# Patient Record
Sex: Male | Born: 1990 | Race: Black or African American | Hispanic: No | Marital: Married | Smoking: Current some day smoker
Health system: Southern US, Community
[De-identification: ages and names within clinical notes are randomized; demographics above are authoritative.]

---

## 2019-03-04 ENCOUNTER — Emergency Department (HOSPITAL_COMMUNITY)
Admission: EM | Admit: 2019-03-04 | Discharge: 2019-03-04 | Disposition: A | Discharge status: Home / Self Care | Payer: Self-pay | Attending: Emergency Medicine | Admitting: Emergency Medicine

## 2019-03-04 ENCOUNTER — Encounter (HOSPITAL_COMMUNITY): Payer: Self-pay

## 2019-03-04 ENCOUNTER — Other Ambulatory Visit: Payer: Self-pay

## 2019-03-04 DIAGNOSIS — J069 Acute upper respiratory infection, unspecified: Secondary | ICD-10-CM | POA: Insufficient documentation

## 2019-03-04 DIAGNOSIS — F17210 Nicotine dependence, cigarettes, uncomplicated: Secondary | ICD-10-CM | POA: Insufficient documentation

## 2019-03-04 LAB — GROUP A STREP BY PCR: Group A Strep by PCR: NOT DETECTED

## 2019-03-04 NOTE — ED Provider Notes (Signed)
[facility] EMERGENCY DEPARTMENT Provider Note   CSN: [id number] Arrival date & time: 03/04/19  0735    History   Chief Complaint Chief Complaint  Patient presents with  . Sore Throat  . Nasal Congestion    HPI Nicholas House. is a 28 y.o. male.     HPI   28 year old male with no significant past medical history presents today with complaints of sore throat and upper respiratory infection.  Patient had symptoms started approximately 1 week ago with rhinorrhea nasal congestion and sore throat.  He notes painful swallowing no difficulty swallowing no pooling of secretions.  He notes rhinorrhea and nasal congestion.  He notes taking NyQuil at home without symptomatic improvement.  He denies any fever, denies any cough or shortness of breath.  He denies any close sick contacts, denies any recent travel.  He works at a call center.     History reviewed. No pertinent past medical history.  There are no active problems to display for this patient.   History reviewed. No pertinent surgical history.      Home Medications    Prior to Admission medications   Not on File    Family History No family history on file.  Social History Social History   Tobacco Use  . Smoking status: Current Some Day Smoker    Types: Cigarettes  Substance Use Topics  . Alcohol use: Yes  . Drug use: Yes    Types: Marijuana     Allergies   Patient has no allergy information on record.   Review of Systems Review of Systems  All other systems reviewed and are negative.    Physical Exam Updated Vital Signs BP (!) 135/95 (BP Location: Right Arm)   Pulse 88   Temp 98.1 F (36.7 C)   Resp 12   SpO2 100%   Physical Exam Vitals signs and nursing note reviewed.  Constitutional:      Appearance: He is well-developed.  HENT:     Head: Normocephalic and atraumatic.     Comments: Oropharynx with minor erythema, minimal bilateral tonsillar swelling, no  exudate noted, uvula midline rises with phonation, no pooling of secretions, voice normal neck supple full active range of motion-rhinorrhea noted Eyes:     General: No scleral icterus.       Right eye: No discharge.        Left eye: No discharge.     Conjunctiva/sclera: Conjunctivae normal.     Pupils: Pupils are equal, round, and reactive to light.  Neck:     Musculoskeletal: Normal range of motion.     Vascular: No JVD.     Trachea: No tracheal deviation.  Pulmonary:     Effort: Pulmonary effort is normal. No respiratory distress.     Breath sounds: Normal breath sounds. No stridor. No wheezing, rhonchi or rales.  Neurological:     Mental Status: He is alert and oriented to person, place, and time.     Coordination: Coordination normal.  Psychiatric:        Behavior: Behavior normal.        Thought Content: Thought content normal.        Judgment: Judgment normal.      ED Treatments / Results  Labs (all labs ordered are listed, but only abnormal results are displayed) Labs Reviewed  GROUP A STREP BY PCR    EKG None  Radiology No results found.  Procedures Procedures (including critical care time)  Medications  Ordered in ED Medications - No data to display   Initial Impression / Assessment and Plan / ED Course  I have reviewed the triage vital signs and the nursing notes.  Pertinent labs & imaging results that were available during my care of the patient were reviewed by me and considered in my medical decision making (see chart for details).        Labs: Group A strep by PCR  Imaging:  Consults:  Therapeutics:  Discharge Meds:   Assessment/Plan: 28 year old male presents today with likely viral URI.  Patient is very well-appearing in no acute distress.  He has negative group A strep by PCR here.  He has no signs of deep space infection.  He is afebrile I have lower suspicion for influenza or coronavirus at this time although they remain in the  differential.  Patient is stable for outpatient management, return precautions given.  Verbalized understanding and agreement to today's plan.  Taris Villalva. was evaluated in Emergency Department on 03/04/2019 for the symptoms described in the history of present illness. He was evaluated in the context of the global COVID-19 pandemic, which necessitated consideration that the patient might be at risk for infection with the SARS-CoV-2 virus that causes COVID-19. Institutional protocols and algorithms that pertain to the evaluation of patients at risk for COVID-19 are in a state of rapid change based on information released by regulatory bodies including the CDC and federal and state organizations. These policies and algorithms were followed during the patient's care in the ED.    Final Clinical Impressions(s) / ED Diagnoses   Final diagnoses:  Viral upper respiratory tract infection    ED Discharge Orders    None       Eyvonne Mechanic, PA-C 03/04/19 0845    Gwyneth Sprout, MD 03/04/19 1505

## 2019-03-04 NOTE — Discharge Instructions (Addendum)
Please read attached information. If you experience any new or worsening signs or symptoms please return to the emergency room for evaluation. Please follow-up with your primary care provider or specialist as discussed. Please use medication prescribed only as directed and discontinue taking if you have any concerning signs or symptoms.   °

## 2019-03-04 NOTE — ED Notes (Signed)
Jeff PA at bedside.  

## 2019-03-04 NOTE — ED Triage Notes (Signed)
Per pt: He states that his throat has been hurting for the last few days. Pt states that pain is constant. Denies contact with known corona virus. Pt states that he took nyquil for pain last night and that it did not help. No meds PTA. Pt also has a runny nose with thin clear discharge.

## 2019-04-21 ENCOUNTER — Other Ambulatory Visit: Payer: Self-pay

## 2019-04-21 ENCOUNTER — Encounter (HOSPITAL_COMMUNITY): Payer: Self-pay

## 2019-04-21 ENCOUNTER — Inpatient Hospital Stay (HOSPITAL_COMMUNITY)
Admission: EM | Admit: 2019-04-21 | Discharge: 2019-04-21 | DRG: 711 | Disposition: A | Discharge status: Home / Self Care | Payer: Self-pay | Attending: Urology | Admitting: Urology

## 2019-04-21 ENCOUNTER — Emergency Department (HOSPITAL_COMMUNITY): Payer: Self-pay | Admitting: Certified Registered"

## 2019-04-21 ENCOUNTER — Ambulatory Visit (HOSPITAL_COMMUNITY): Payer: Self-pay

## 2019-04-21 ENCOUNTER — Encounter (HOSPITAL_COMMUNITY)
Admission: EM | Disposition: A | Discharge status: Home / Self Care | Payer: Self-pay | Source: Home / Self Care | Attending: Urology

## 2019-04-21 DIAGNOSIS — Z9889 Other specified postprocedural states: Secondary | ICD-10-CM

## 2019-04-21 DIAGNOSIS — F17210 Nicotine dependence, cigarettes, uncomplicated: Secondary | ICD-10-CM | POA: Diagnosis present

## 2019-04-21 DIAGNOSIS — U071 COVID-19: Secondary | ICD-10-CM | POA: Diagnosis present

## 2019-04-21 DIAGNOSIS — N4400 Torsion of testis, unspecified: Principal | ICD-10-CM | POA: Diagnosis present

## 2019-04-21 DIAGNOSIS — Z791 Long term (current) use of non-steroidal anti-inflammatories (NSAID): Secondary | ICD-10-CM

## 2019-04-21 DIAGNOSIS — Z91010 Allergy to peanuts: Secondary | ICD-10-CM

## 2019-04-21 DIAGNOSIS — N433 Hydrocele, unspecified: Secondary | ICD-10-CM | POA: Diagnosis present

## 2019-04-21 HISTORY — PX: ORCHIOPEXY: SHX479

## 2019-04-21 LAB — COMPREHENSIVE METABOLIC PANEL
ALT: 23 U/L (ref 0–44)
AST: 26 U/L (ref 15–41)
Albumin: 4.5 g/dL (ref 3.5–5.0)
Alkaline Phosphatase: 53 U/L (ref 38–126)
Anion gap: 14 (ref 5–15)
BUN: 12 mg/dL (ref 6–20)
CO2: 22 mmol/L (ref 22–32)
Calcium: 9.8 mg/dL (ref 8.9–10.3)
Chloride: 103 mmol/L (ref 98–111)
Creatinine, Ser: 1.13 mg/dL (ref 0.61–1.24)
GFR calc Af Amer: >60 mL/min (ref >60)
GFR calc non Af Amer: >60 mL/min (ref >60)
Glucose, Bld: 141 mg/dL — ABNORMAL HIGH (ref 70–99)
Potassium: 3.1 mmol/L — ABNORMAL LOW (ref 3.5–5.1)
Sodium: 139 mmol/L (ref 135–145)
Total Bilirubin: 1.3 mg/dL — ABNORMAL HIGH (ref 0.3–1.2)
Total Protein: 7.7 g/dL (ref 6.5–8.1)

## 2019-04-21 LAB — CBC WITH DIFFERENTIAL/PLATELET
Abs Immature Granulocytes: 0.06 10*3/uL (ref 0.00–0.07)
Basophils Absolute: 0 10*3/uL (ref 0.0–0.1)
Basophils Relative: 0 %
Eosinophils Absolute: 0 10*3/uL (ref 0.0–0.5)
Eosinophils Relative: 0 %
HCT: 44 % (ref 39.0–52.0)
Hemoglobin: 14.7 g/dL (ref 13.0–17.0)
Immature Granulocytes: 1 %
Lymphocytes Relative: 8 %
Lymphs Abs: 1 10*3/uL (ref 0.7–4.0)
MCH: 28.1 pg (ref 26.0–34.0)
MCHC: 33.4 g/dL (ref 30.0–36.0)
MCV: 84 fL (ref 80.0–100.0)
Monocytes Absolute: 0.5 10*3/uL (ref 0.1–1.0)
Monocytes Relative: 4 %
Neutro Abs: 11.4 10*3/uL — ABNORMAL HIGH (ref 1.7–7.7)
Neutrophils Relative %: 87 %
Platelets: 265 10*3/uL (ref 150–400)
RBC: 5.24 MIL/uL (ref 4.22–5.81)
RDW: 12.6 % (ref 11.5–15.5)
WBC: 13 10*3/uL — ABNORMAL HIGH (ref 4.0–10.5)
nRBC: 0 % (ref 0.0–0.2)

## 2019-04-21 LAB — SARS CORONAVIRUS 2 BY RT PCR (HOSPITAL ORDER, PERFORMED IN ~~LOC~~ HOSPITAL LAB): SARS Coronavirus 2: POSITIVE — AB

## 2019-04-21 LAB — LIPASE, BLOOD: Lipase: 24 U/L (ref 11–51)

## 2019-04-21 SURGERY — ORCHIOPEXY ADULT
Anesthesia: General | Laterality: Bilateral

## 2019-04-21 MED ORDER — SODIUM CHLORIDE 0.9 % IV SOLN
INTRAVENOUS | Status: AC
  Filled 2019-04-21: qty 500000

## 2019-04-21 MED ORDER — SUCCINYLCHOLINE CHLORIDE 20 MG/ML IJ SOLN
INTRAMUSCULAR | Status: DC | PRN
  Administered 2019-04-21: 120 mg via INTRAVENOUS

## 2019-04-21 MED ORDER — PROPOFOL 10 MG/ML IV BOLUS
INTRAVENOUS | Status: AC
  Filled 2019-04-21: qty 20

## 2019-04-21 MED ORDER — PROPOFOL 10 MG/ML IV BOLUS
INTRAVENOUS | Status: DC | PRN
  Administered 2019-04-21: 180 mg via INTRAVENOUS

## 2019-04-21 MED ORDER — MORPHINE SULFATE (PF) 4 MG/ML IV SOLN
4.0000 mg | Freq: Once | INTRAVENOUS | Status: AC
  Administered 2019-04-21: 4 mg via INTRAVENOUS
  Filled 2019-04-21: qty 1

## 2019-04-21 MED ORDER — FENTANYL CITRATE (PF) 100 MCG/2ML IJ SOLN
INTRAMUSCULAR | Status: DC | PRN
  Administered 2019-04-21: 50 ug via INTRAVENOUS
  Administered 2019-04-21 (×2): 100 ug via INTRAVENOUS

## 2019-04-21 MED ORDER — CEFAZOLIN SODIUM-DEXTROSE 2-4 GM/100ML-% IV SOLN
2.0000 g | Freq: Once | INTRAVENOUS | Status: AC
  Administered 2019-04-21: 2 g via INTRAVENOUS

## 2019-04-21 MED ORDER — FENTANYL CITRATE (PF) 250 MCG/5ML IJ SOLN
INTRAMUSCULAR | Status: AC
  Filled 2019-04-21: qty 5

## 2019-04-21 MED ORDER — SUCCINYLCHOLINE CHLORIDE 200 MG/10ML IV SOSY
PREFILLED_SYRINGE | INTRAVENOUS | Status: AC
  Filled 2019-04-21: qty 10

## 2019-04-21 MED ORDER — PHENYLEPHRINE 40 MCG/ML (10ML) SYRINGE FOR IV PUSH (FOR BLOOD PRESSURE SUPPORT)
PREFILLED_SYRINGE | INTRAVENOUS | Status: AC
  Filled 2019-04-21: qty 10

## 2019-04-21 MED ORDER — ONDANSETRON HCL 4 MG/2ML IJ SOLN
4.0000 mg | Freq: Once | INTRAMUSCULAR | Status: AC
  Administered 2019-04-21: 13:00:00 4 mg via INTRAVENOUS
  Filled 2019-04-21: qty 2

## 2019-04-21 MED ORDER — ONDANSETRON HCL 4 MG/2ML IJ SOLN
INTRAMUSCULAR | Status: DC | PRN
  Administered 2019-04-21: 4 mg via INTRAVENOUS

## 2019-04-21 MED ORDER — SODIUM CHLORIDE 0.9 % IV BOLUS
1000.0000 mL | Freq: Once | INTRAVENOUS | Status: AC
  Administered 2019-04-21: 1000 mL via INTRAVENOUS

## 2019-04-21 MED ORDER — BUPIVACAINE HCL (PF) 0.25 % IJ SOLN
INTRAMUSCULAR | Status: AC
  Filled 2019-04-21: qty 30

## 2019-04-21 MED ORDER — CEFAZOLIN (ANCEF) 1 G IV SOLR: 2.0000 g | INTRAVENOUS | Status: DC

## 2019-04-21 MED ORDER — SENNOSIDES-DOCUSATE SODIUM 8.6-50 MG PO TABS: 1.0000 | ORAL_TABLET | Freq: Two times a day (BID) | ORAL | 0 refills | Status: DC

## 2019-04-21 MED ORDER — LACTATED RINGERS IV SOLN
INTRAVENOUS | Status: DC
  Administered 2019-04-21 (×3): via INTRAVENOUS

## 2019-04-21 MED ORDER — MIDAZOLAM HCL 2 MG/2ML IJ SOLN
INTRAMUSCULAR | Status: AC
  Filled 2019-04-21: qty 2

## 2019-04-21 MED ORDER — OXYCODONE-ACETAMINOPHEN 5-325 MG PO TABS: 1.0000 | ORAL_TABLET | Freq: Four times a day (QID) | ORAL | 0 refills | Status: AC | PRN

## 2019-04-21 MED ORDER — 0.9 % SODIUM CHLORIDE (POUR BTL) OPTIME
TOPICAL | Status: DC | PRN
  Administered 2019-04-21: 1000 mL

## 2019-04-21 MED ORDER — MIDAZOLAM HCL 2 MG/2ML IJ SOLN
INTRAMUSCULAR | Status: DC | PRN
  Administered 2019-04-21: 2 mg via INTRAVENOUS

## 2019-04-21 MED ORDER — BUPIVACAINE HCL (PF) 0.25 % IJ SOLN
INTRAMUSCULAR | Status: DC | PRN
  Administered 2019-04-21: 10 mL

## 2019-04-21 MED ORDER — ROCURONIUM 10MG/ML (10ML) SYRINGE FOR MEDFUSION PUMP - OPTIME
INTRAVENOUS | Status: DC | PRN
  Administered 2019-04-21: 40 mg via INTRAVENOUS

## 2019-04-21 MED ORDER — LIDOCAINE HCL 1 % IJ SOLN
INTRAMUSCULAR | Status: DC | PRN
  Administered 2019-04-21: 60 mg via INTRADERMAL

## 2019-04-21 MED ORDER — LIDOCAINE 2% (20 MG/ML) 5 ML SYRINGE
INTRAMUSCULAR | Status: AC
  Filled 2019-04-21: qty 5

## 2019-04-21 MED ORDER — SUGAMMADEX SODIUM 500 MG/5ML IV SOLN
INTRAVENOUS | Status: AC
  Filled 2019-04-21: qty 5

## 2019-04-21 MED ORDER — EPHEDRINE 5 MG/ML INJ
INTRAVENOUS | Status: AC
  Filled 2019-04-21: qty 10

## 2019-04-21 SURGICAL SUPPLY — 48 items
BAG DECANTER FOR FLEXI CONT (MISCELLANEOUS) ×1 IMPLANT
BAG URO CATCHER STRL LF (MISCELLANEOUS) ×1 IMPLANT
BLADE 10 SAFETY STRL DISP (BLADE) ×1 IMPLANT
BLADE SURG 15 STRL LF DISP TIS (BLADE) ×1 IMPLANT
BLADE SURG 15 STRL SS (BLADE) ×1
BNDG GAUZE ELAST 4 BULKY (GAUZE/BANDAGES/DRESSINGS) IMPLANT
BRIEF STRETCH FOR OB PAD LRG (UNDERPADS AND DIAPERS) ×2 IMPLANT
COVER SURGICAL LIGHT HANDLE (MISCELLANEOUS) ×2 IMPLANT
COVER WAND RF STERILE (DRAPES) ×2 IMPLANT
DRAIN PENROSE 1/4X12 LTX (DRAIN) ×1 IMPLANT
DRAPE HALF SHEET 40X57 (DRAPES) IMPLANT
DRAPE LAPAROTOMY T 102X78X121 (DRAPES) ×1 IMPLANT
ELECT REM PT RETURN 9FT ADLT (ELECTROSURGICAL) ×2
ELECTRODE REM PT RTRN 9FT ADLT (ELECTROSURGICAL) ×1 IMPLANT
GAUZE 4X4 16PLY RFD (DISPOSABLE) ×2 IMPLANT
GAUZE SPONGE 4X4 12PLY STRL (GAUZE/BANDAGES/DRESSINGS) ×1 IMPLANT
GLOVE BIOGEL M STRL SZ7.5 (GLOVE) ×2 IMPLANT
GOWN STRL REUS W/ TWL LRG LVL3 (GOWN DISPOSABLE) ×2 IMPLANT
GOWN STRL REUS W/ TWL XL LVL3 (GOWN DISPOSABLE) IMPLANT
GOWN STRL REUS W/TWL LRG LVL3 (GOWN DISPOSABLE) ×2
GOWN STRL REUS W/TWL XL LVL3 (GOWN DISPOSABLE) ×2
HANDPIECE INTERPULSE COAX TIP (DISPOSABLE)
KIT BASIN OR (CUSTOM PROCEDURE TRAY) ×2 IMPLANT
KIT TURNOVER KIT B (KITS) ×2 IMPLANT
LEGGING LITHOTOMY PAIR STRL (DRAPES) IMPLANT
NDL HYPO 25X1 1.5 SAFETY (NEEDLE) IMPLANT
NEEDLE HYPO 25X1 1.5 SAFETY (NEEDLE) ×2 IMPLANT
NS IRRIG 1000ML POUR BTL (IV SOLUTION) ×2 IMPLANT
PACK SURGICAL SETUP 50X90 (CUSTOM PROCEDURE TRAY) ×2 IMPLANT
PAD ARMBOARD 7.5X6 YLW CONV (MISCELLANEOUS) ×4 IMPLANT
PENCIL BUTTON HOLSTER BLD 10FT (ELECTRODE) ×2 IMPLANT
PENCIL SMOKE EVACUATOR (MISCELLANEOUS) ×1 IMPLANT
SET HNDPC FAN SPRY TIP SCT (DISPOSABLE) IMPLANT
SPONGE LAP 4X18 RFD (DISPOSABLE) ×1 IMPLANT
SUT CHROMIC 3 0 PS 2 (SUTURE) IMPLANT
SUT CHROMIC 3 0 SH 27 (SUTURE) IMPLANT
SUT CHROMIC 4 0 RB 1X27 (SUTURE) IMPLANT
SUT ETHILON 3 0 PS 1 (SUTURE) ×1 IMPLANT
SUT MNCRL AB 4-0 PS2 18 (SUTURE) ×1 IMPLANT
SUT PROLENE 4 0 RB 1 (SUTURE)
SUT PROLENE 4 0 SH DA (SUTURE) ×1 IMPLANT
SUT PROLENE 4-0 RB1 .5 CRCL 36 (SUTURE) IMPLANT
SUT VIC AB 3-0 SH 27 (SUTURE) ×1
SUT VIC AB 3-0 SH 27XBRD (SUTURE) IMPLANT
SYR CONTROL 10ML LL (SYRINGE) ×2 IMPLANT
TUBE CONNECTING 12X1/4 (SUCTIONS) ×2 IMPLANT
WATER STERILE IRR 1000ML POUR (IV SOLUTION) ×1 IMPLANT
YANKAUER SUCT BULB TIP NO VENT (SUCTIONS) ×2 IMPLANT

## 2019-04-21 NOTE — Anesthesia Procedure Notes (Signed)
Performed by: Lisel Siegrist A, CRNA       

## 2019-04-21 NOTE — Anesthesia Preprocedure Evaluation (Addendum)
Anesthesia Evaluation  Patient identified by MRN, date of birth, ID band Patient awake    Reviewed: Allergy & Precautions, NPO status , Patient's Chart, lab work & pertinent test results  Airway Mallampati: I  TM Distance: >3 FB Neck ROM: Full    Dental  (+) Teeth Intact, Dental Advisory Given   Pulmonary Current Smoker,    breath sounds clear to auscultation       Cardiovascular negative cardio ROS   Rhythm:Regular Rate:Normal     Neuro/Psych    GI/Hepatic negative GI ROS, Neg liver ROS,   Endo/Other  negative endocrine ROS  Renal/GU negative Renal ROS     Musculoskeletal negative musculoskeletal ROS (+)   Abdominal Normal abdominal exam  (+)   Peds  Hematology negative hematology ROS (+)   Anesthesia Other Findings   Reproductive/Obstetrics                            Lab Results  Component Value Date   WBC 13.0 (H) 04/21/2019   HGB 14.7 04/21/2019   HCT 44.0 04/21/2019   MCV 84.0 04/21/2019   PLT 265 04/21/2019     Anesthesia Physical Anesthesia Plan  ASA: I  Anesthesia Plan: General   Post-op Pain Management:    Induction: Intravenous, Rapid sequence and Cricoid pressure planned  PONV Risk Score and Plan: 2 and Ondansetron, Dexamethasone and Midazolam  Airway Management Planned: Oral ETT  Additional Equipment: None  Intra-op Plan:   Post-operative Plan: Extubation in OR  Informed Consent: I have reviewed the patients History and Physical, chart, labs and discussed the procedure including the risks, benefits and alternatives for the proposed anesthesia with the patient or authorized representative who has indicated his/her understanding and acceptance.     Dental advisory given  Plan Discussed with: CRNA  Anesthesia Plan Comments: ( )       Anesthesia Quick Evaluation

## 2019-04-21 NOTE — ED Triage Notes (Signed)
Pt reports severe right testicle pain radiating to back and down right leg and N/V starting suddenly this am at 0700.Took ibuprofen at home without relief.

## 2019-04-21 NOTE — ED Notes (Signed)
Nurse navigator notified patient primary nurse giving report and about to go to surgery. Patient currently on phone and will let family now himself.

## 2019-04-21 NOTE — Anesthesia Postprocedure Evaluation (Signed)
Anesthesia Post Note  Patient: Nicholas House.  Procedure(s) Performed: Open right detorsion and bilateral orchiopexy (Bilateral )     Patient location during evaluation: Other Anesthesia Type: General Level of consciousness: awake and alert, patient cooperative and oriented Pain management: pain level controlled Vital Signs Assessment: post-procedure vital signs reviewed and stable Respiratory status: spontaneous breathing, nonlabored ventilation and respiratory function stable Cardiovascular status: blood pressure returned to baseline and stable Postop Assessment: no apparent nausea or vomiting and able to ambulate Anesthetic complications: no                   [name],E. Nusayba Cadenas

## 2019-04-21 NOTE — Progress Notes (Signed)
PT GAVE VERBAL CONSENT AT 1811 TO GIVE MEDICAL INFORMATION TO FATHER

## 2019-04-21 NOTE — OR Nursing (Signed)
DISCUSSED DISCHARGE AND COVID INSTRUCTIONS WITH PATIENT

## 2019-04-21 NOTE — Brief Op Note (Signed)
04/21/2019  5:46 PM  PATIENT:  Nicholas House.  28 y.o. male  PRE-OPERATIVE DIAGNOSIS:  Right testicular torsion  POST-OPERATIVE DIAGNOSIS:  * No post-op diagnosis entered *  PROCEDURE:  Procedure(s): Open right detorsion and bilateral orchiopexy (Bilateral)  SURGEON:  Surgeon(s) and Role:    Sebastian Ache, MD - Primary  PHYSICIAN ASSISTANT:   ASSISTANTS: none   ANESTHESIA:   local and general  EBL:  minimal   BLOOD ADMINISTERED:none  DRAINS: Penrose drain in the dependant scrotum   LOCAL MEDICATIONS USED:  MARCAINE     SPECIMEN:  No Specimen  DISPOSITION OF SPECIMEN:  N/A  COUNTS:  YES  TOURNIQUET:  * No tourniquets in log *  DICTATION: .Other Dictation: Dictation Number [id number]  PLAN OF CARE: Discharge to home after PACU  PATIENT DISPOSITION:  remain to recover in COVID OR   Delay start of Pharmacological VTE agent (>24hrs) due to surgical blood loss or risk of bleeding: yes

## 2019-04-21 NOTE — Transfer of Care (Signed)
Immediate Anesthesia Transfer of Care Note  Patient: Nicholas House.  Procedure(s) Performed: Open right detorsion and bilateral orchiopexy (Bilateral )  Patient Location: PACU and discharged home  Anesthesia Type:General  Level of Consciousness: awake, alert  and oriented  Airway & Oxygen Therapy: Patient Spontanous Breathing  Post-op Assessment: instructions to father  Post vital signs: Reviewed and stable  Last Vitals:  Vitals Value Taken Time  BP    Temp    Pulse    Resp    SpO2      Last Pain:  Vitals:   04/21/19 1253  TempSrc:   PainSc: 10-Worst pain ever      Patients Stated Pain Goal: 0 (04/21/19 1253)  Complications: No apparent anesthesia complications

## 2019-04-21 NOTE — ED Notes (Signed)
This RN acting as Statistician- pt able to call family. Has cell phone present.

## 2019-04-21 NOTE — Anesthesia Procedure Notes (Signed)
Procedure Name: Intubation Date/Time: 04/21/2019 5:04 PM Performed by: Adair Laundry, CRNA Pre-anesthesia Checklist: Patient identified Patient Re-evaluated:Patient Re-evaluated prior to induction Oxygen Delivery Method: Circle system utilized Preoxygenation: Pre-oxygenation with 100% oxygen Induction Type: IV induction Laryngoscope Size: Glidescope Grade View: Grade I Tube type: Oral Tube size: 7.5 mm Number of attempts: 1 Airway Equipment and Method: Stylet and Video-laryngoscopy Placement Confirmation: ETT inserted through vocal cords under direct vision and positive ETCO2 Secured at: 23 cm Tube secured with: Tape Dental Injury: Teeth and Oropharynx as per pre-operative assessment

## 2019-04-21 NOTE — H&P (Signed)
Nicholas ParkerJerry Michael Casselman Jr. is an 28 y.o. male.    Chief Complaint: RIGHT Testicular Torsion  HPI:   1 - RIGHT Testicular Torsion - acute onset right scrotal pain abtout 7 am and worsened throughout day. Dopplar US form ER with no right flow c/w acute torsion. Left side unremarkable. NO scrotal trauma. NO prior episodes. Last meal crackers at 7am.  PMH sig for knee surgery. NO CV disease / blood thinners. 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  Today "Dorene SorrowJerry" is seen for emergent evaluation of likely testicular torsion. No fevers, resp symptoms, known COVID contacts.   History reviewed. No pertinent past medical history.  History reviewed. No pertinent surgical history.  History reviewed. No pertinent family history. Social History:  reports that he has been smoking cigarettes. He has never used smokeless tobacco. He reports current alcohol use. He reports current drug use. Drug: Marijuana.  Allergies:  Allergies  Allergen Reactions  . Peanut-Containing Drug Products Other (See Comments)    Makes him sick    Medications Prior to Admission  Medication Sig Dispense Refill  . ibuprofen (ADVIL) 200 MG tablet Take 200 mg by mouth every 6 (six) hours as needed for moderate pain.      Results for orders placed or performed during the hospital encounter of 04/21/19 (from the past 48 hour(s))  Comprehensive metabolic panel     Status: Abnormal   Collection Time: 04/21/19 12:50 PM  Result Value Ref Range   Sodium 139 135 - 145 mmol/L   Potassium 3.1 (L) 3.5 - 5.1 mmol/L   Chloride 103 98 - 111 mmol/L   CO2 22 22 - 32 mmol/L   Glucose, Bld 141 (H) 70 - 99 mg/dL   BUN 12 6 - 20 mg/dL   Creatinine, Ser 4.091.13 0.61 - 1.24 mg/dL   Calcium 9.8 8.9 - 81.110.3 mg/dL   Total Protein 7.7 6.5 - 8.1 g/dL   Albumin 4.5 3.5 - 5.0 g/dL   AST 26 15 - 41 U/L   ALT 23 0 - 44 U/L   Alkaline Phosphatase 53 38 - 126 U/L   Total Bilirubin 1.3 (H) 0.3 - 1.2 mg/dL   GFR calc non Af Amer >60  >60 mL/min   GFR calc Af Amer >60 >60 mL/min   Anion gap 14 5 - 15    Comment: Performed at [facility], [address] [place], [place] [address]  CBC with Differential     Status: Abnormal   Collection Time: 04/21/19 12:50 PM  Result Value Ref Range   WBC 13.0 (H) 4.0 - 10.5 K/uL   RBC 5.24 4.22 - 5.81 MIL/uL   Hemoglobin 14.7 13.0 - 17.0 g/dL   HCT 29.544.0 62.139.0 - 30.852.0 %   MCV 84.0 80.0 - 100.0 fL   MCH 28.1 26.0 - 34.0 pg   MCHC 33.4 30.0 - 36.0 g/dL   RDW 65.712.6 84.611.5 - 96.215.5 %   Platelets 265 150 - 400 K/uL   nRBC 0.0 0.0 - 0.2 %   Neutrophils Relative % 87 %   Neutro Abs 11.4 (H) 1.7 - 7.7 K/uL   Lymphocytes Relative 8 %   Lymphs Abs 1.0 0.7 - 4.0 K/uL   Monocytes Relative 4 %   Monocytes Absolute 0.5 0.1 - 1.0 K/uL   Eosinophils Relative 0 %   Eosinophils Absolute 0.0 0.0 - 0.5 K/uL   Basophils Relative 0 %   Basophils Absolute 0.0 0.0 - 0.1  K/uL   Immature Granulocytes 1 %   Abs Immature Granulocytes 0.06 0.00 - 0.07 K/uL    Comment: Performed at [facility], [address], [place], [place] [address]  Lipase, blood     Status: None   Collection Time: 04/21/19 12:50 PM  Result Value Ref Range   Lipase 24 11 - 51 U/L    Comment: Performed at [facility], [address], [place], [place] [address]   US Scrotum W/doppler  Result Date: 04/21/2019 CLINICAL DATA:  Right testicular pain since this morning. EXAM: SCROTAL ULTRASOUND DOPPLER ULTRASOUND OF THE TESTICLES TECHNIQUE: Complete ultrasound examination of the testicles, epididymis, and other scrotal structures was performed. Color and spectral Doppler ultrasound were also utilized to evaluate blood flow to the testicles. COMPARISON:  None. FINDINGS: Right testicle Measurements: 4.6 x 2.6 x 3.5 cm. No mass or microlithiasis visualized. Echotexture relatively homogeneous. No flow signal identified within the parenchyma on color Doppler evaluation. Pulsed doppler evaluation reveals no arterial or venous  waveform within the testicular parenchyma. Left testicle Measurements: 4.5 x 2.0 x 3.0 cm. No mass or microlithiasis visualized. Color signal evident on Doppler evaluation with arterial and venous waveforms identified on pulsed doppler assessment. Right epididymis: Enlarged with 9 mm epididymal cyst or spermatocele. Left epididymis:  Normal in size and appearance. Hydrocele:  Small on the right. Varicocele: Venous anatomy in the left hemiscrotum measures up to 3 mm diameter with Valsalva, suggesting varicocele. IMPRESSION: 1. No color flow signal evident in the right testicle on Doppler ultrasound. No arterial or venous waveform can be obtained within the right testicular parenchyma on pulse Doppler evaluation. Imaging features are compatible with right testicular torsion. 2. Borderline left varicocele. Critical Value/emergent results were called by telephone at the time of interpretation on 04/21/2019 at 2:14 pm to Dr. Alona Bene , who verbally acknowledged these results. Electronically Signed   By: Kennith Center M.D.   On: 04/21/2019 14:15    Review of Systems  Constitutional: Negative.  Negative for chills and fever.  HENT: Negative.   Eyes: Negative.  Negative for blurred vision.  Respiratory: Negative.  Negative for cough, hemoptysis, sputum production, shortness of breath and wheezing.   Cardiovascular: Negative.   Gastrointestinal: Negative.   Genitourinary: Negative.   Musculoskeletal: Negative.   Skin: Negative.   Neurological: Negative.   Endo/Heme/Allergies: Negative.   Psychiatric/Behavioral: Negative.     Blood pressure 128/64, pulse 76, temperature 98.9 F (37.2 C), temperature source Oral, resp. rate 16, height 6\' 4"  (1.93 m), weight 86.2 kg, SpO2 100 %. Physical Exam  Constitutional: He appears well-developed.  Very pleasant.   HENT:  Head: Normocephalic.  Eyes: Pupils are equal, round, and reactive to light.  Neck: Normal range of motion.  Cardiovascular: Normal rate.   Respiratory: Effort normal.  GI: Soft.  Genitourinary:    Genitourinary Comments: High riding rt testis that is painful.    Musculoskeletal: Normal range of motion.  Neurological: He is alert.  Psychiatric: He has a normal mood and affect.     Assessment/Plan  1 - RIGHT Testicular Torsion - rec emergent scrotal exploration with right detorsion and bilateral pexy as long as right testis appears even remotely viable. Risks, benefits, alternatives, expected peri-op course including need for temporary post-op penrose drain discussed.   Testis viability time sensitive this cannot be delayed for COVD testing, he is low risk.   Sebastian Ache, MD 04/21/2019, 3:55 PM

## 2019-04-21 NOTE — ED Provider Notes (Signed)
Emergency Department Provider Note   I have reviewed the triage vital signs and the nursing notes.   HISTORY  Chief Complaint Testicle Pain and Emesis   HPI Nicholas ParkerJerry Michael Arel Jr. is a 28 y.o. male with no significant past medical history presents to the emergency department with acute onset right testicle pain.  Began at 7 AM.  He denies trauma.  Denies dysuria, hesitancy, urgency.  No gross hematuria or urethral discharge.  No fevers or chills.  Denies lower abdominal or back pain.  Patient has had some associated emesis she attributes to the pain. Pain worse with movement or touching. No radiation of symptoms or other modifying factors.  History reviewed. No pertinent past medical history.  There are no active problems to display for this patient.   History reviewed. No pertinent surgical history.  Allergies Patient has no allergy information on record.  History reviewed. No pertinent family history.  Social History Social History   Tobacco Use  . Smoking status: Current Some Day Smoker    Types: Cigarettes  . Smokeless tobacco: Never Used  Substance Use Topics  . Alcohol use: Yes  . Drug use: Yes    Types: Marijuana    Review of Systems  Constitutional: No fever/chills Eyes: No visual changes. ENT: No sore throat. Cardiovascular: Denies chest pain. Respiratory: Denies shortness of breath. Gastrointestinal: No abdominal pain. Positive nausea and vomiting.  No diarrhea.  No constipation. Genitourinary: Negative for dysuria. Positive right testicle pain.  Musculoskeletal: Negative for back pain. Skin: Negative for rash. Neurological: Negative for headaches, focal weakness or numbness.  10-point ROS otherwise negative.  ____________________________________________   PHYSICAL EXAM:  VITAL SIGNS: ED Triage Vitals [04/21/19 1216]  Enc Vitals Group     BP 128/64     Pulse Rate 76     Resp 16     SpO2 100 %    Constitutional: Alert and oriented.  Well appearing and in no acute distress. Eyes: Conjunctivae are normal.  Head: Atraumatic. Nose: No congestion/rhinnorhea. Mouth/Throat: Mucous membranes are moist.   Neck: No stridor.  Cardiovascular: Normal rate, regular rhythm. Good peripheral circulation. Grossly normal heart sounds.   Respiratory: Normal respiratory effort.  No retractions. Lungs CTAB. Gastrointestinal: Soft and nontender. No distention.  Genitourinary: GU exam performed with verbal consent. Mild tenderness to palpation over the right testicle. No scrotal changes such as cellulitis. Normal testicle lye. Normal cremasteric reflex.  Musculoskeletal: No lower extremity tenderness nor edema. No gross deformities of extremities. Neurologic:  Normal speech and language. No gross focal neurologic deficits are appreciated.  Skin:  Skin is warm, dry and intact. No rash noted.  ____________________________________________   LABS (all labs ordered are listed, but only abnormal results are displayed)  Labs Reviewed  COMPREHENSIVE METABOLIC PANEL - Abnormal; Notable for the following components:      Result Value   Potassium 3.1 (*)    Glucose, Bld 141 (*)    Total Bilirubin 1.3 (*)    All other components within normal limits  CBC WITH DIFFERENTIAL/PLATELET - Abnormal; Notable for the following components:   WBC 13.0 (*)    Neutro Abs 11.4 (*)    All other components within normal limits  LIPASE, BLOOD   ____________________________________________  RADIOLOGY  [place] Scrotum W/doppler  Result Date: 04/21/2019 CLINICAL DATA:  Right testicular pain since this morning. EXAM: SCROTAL ULTRASOUND DOPPLER ULTRASOUND OF THE TESTICLES TECHNIQUE: Complete ultrasound examination of the testicles, epididymis, and other scrotal structures was performed. Color and spectral  Doppler ultrasound were also utilized to evaluate blood flow to the testicles. COMPARISON:  None. FINDINGS: Right testicle Measurements: 4.6 x 2.6 x 3.5 cm. No mass  or microlithiasis visualized. Echotexture relatively homogeneous. No flow signal identified within the parenchyma on color Doppler evaluation. Pulsed doppler evaluation reveals no arterial or venous waveform within the testicular parenchyma. Left testicle Measurements: 4.5 x 2.0 x 3.0 cm. No mass or microlithiasis visualized. Color signal evident on Doppler evaluation with arterial and venous waveforms identified on pulsed doppler assessment. Right epididymis: Enlarged with 9 mm epididymal cyst or spermatocele. Left epididymis:  Normal in size and appearance. Hydrocele:  Small on the right. Varicocele: Venous anatomy in the left hemiscrotum measures up to 3 mm diameter with Valsalva, suggesting varicocele. IMPRESSION: 1. No color flow signal evident in the right testicle on Doppler ultrasound. No arterial or venous waveform can be obtained within the right testicular parenchyma on pulse Doppler evaluation. Imaging features are compatible with right testicular torsion. 2. Borderline left varicocele. Critical Value/emergent results were called by telephone at the time of interpretation on 04/21/2019 at 2:14 pm to Dr. Alona Bene , who verbally acknowledged these results. Electronically Signed   By: Kennith Center M.D.   On: 04/21/2019 14:15    ____________________________________________   PROCEDURES  Procedure(s) performed:   Procedures  CRITICAL CARE Performed by: Maia Plan Total critical care time: 35 minutes Critical care time was exclusive of separately billable procedures and treating other patients. Critical care was necessary to treat or prevent imminent or life-threatening deterioration. Critical care was time spent personally by me on the following activities: development of treatment plan with patient and/or surrogate as well as nursing, discussions with consultants, evaluation of patient's response to treatment, examination of patient, obtaining history from patient or surrogate,  ordering and performing treatments and interventions, ordering and review of laboratory studies, ordering and review of radiographic studies, pulse oximetry and re-evaluation of patient's condition.  Alona Bene, MD Emergency Medicine  ____________________________________________   INITIAL IMPRESSION / ASSESSMENT AND PLAN / ED COURSE  Pertinent labs & imaging results that were available during my care of the patient were reviewed by me and considered in my medical decision making (see chart for details).   Presents to the emergency department with acute onset right testicle pain.  Lie of the testicle is normal and cremasteric reflex is intact.  My suspicion for torsion remains somewhat elevated and feel he would need STAT testicle US.   02:15 PM  Called by radiology who report testicular torsion on the right.  Updated patient and mom by phone at his request.  Paged urology to discuss.   02:20 PM  Spoke with Dr. Berneice Heinrich. Plans for operative repair. Updated patient. Last solid food PO was last night. Sips of fluid last at noon today.   Discussed patient's case with Urology to request admission. Patient and family (if present) updated with plan. Care transferred to Urology service.  I reviewed all nursing notes, vitals, pertinent old records, labs, imaging (as available).  ____________________________________________  FINAL CLINICAL IMPRESSION(S) / ED DIAGNOSES  Final diagnoses:  Testicular torsion     MEDICATIONS GIVEN DURING THIS VISIT:  Medications  sodium chloride 0.9 % bolus 1,000 mL (0 mLs Intravenous Stopped 04/21/19 1417)  morphine 4 MG/ML injection 4 mg (4 mg Intravenous Given 04/21/19 1250)  ondansetron (ZOFRAN) injection 4 mg (4 mg Intravenous Given 04/21/19 1251)  morphine 4 MG/ML injection 4 mg (4 mg Intravenous Given 04/21/19 1432)  Note:  This document was prepared using Dragon voice recognition software and may include unintentional dictation errors.  Alona Bene,  MD Emergency Medicine    Long, Arlyss Repress, MD 04/21/19 [id number]

## 2019-04-21 NOTE — OR Nursing (Signed)
Discussed discharge instructions for procedure and COVID 19 instructions with Meta Hatchet, Father.

## 2019-04-21 NOTE — ED Notes (Signed)
Pt's grandfather Nicholas House [phone]. Pls contact

## 2019-04-21 NOTE — OR Nursing (Signed)
1825 PT TEMP 97.9

## 2019-04-21 NOTE — ED Notes (Signed)
Patient transported to Ultrasound 

## 2019-04-21 NOTE — OR Nursing (Signed)
PT DISCHARGED FROM OR TO HOME

## 2019-04-21 NOTE — OR Nursing (Signed)
Received call at 8 pm on 04-21-2019 from Meta Hatchet Senior. He stated that that his son Kaleab, Berley was missing his [place] ID and 2 credit cards upon discharge. Items were located, and Meta Hatchet Senior picked up items from the hospital at 8:25 pm. RN Fleet Contras Mattadeen-Swaby handed ID and 2 cards to Meta Hatchet Senior.

## 2019-04-22 ENCOUNTER — Encounter (HOSPITAL_COMMUNITY): Payer: Self-pay | Admitting: Urology

## 2019-04-22 NOTE — Discharge Instructions (Signed)
1 - You may have scrotal swelling and drainage of pink fluid with drain in place. This is normal.  2 - \No straddle activity or lifting >20 lbs for 10 days.   3 - Call MD or go to ER for fever >102, severe pain / nausea / vomiting not relieved by medications, or acute change in medical status Patient Discharge   Nicholas House / [id number] DOB: Jan 18, 1991   Admitted 04/21/2019 Discharged: 04/21/2019    Scheduled Meds: Continuous Infusions:  PRN Meds: Personal Items   Please collect all clothing which belongs to you from your nurse. Please collect any valuables you stored during your stay from the front desk, and please remember all of your personal items, such as dentures, canes, and eyeglasses.   Activity Instructions   You must avoid lifting more than 20 pounds until your physician instructs you differently. You should avoid driving. You may resume activities per MD order following 04/23/2019 appointment.   I understand that if any problems occur once I am at home I am to contact my physician.  I understand and acknowledge receipt of the instructions indicated above.    _____________________________________________                                                       Physician's or R.N.'s Signature                Date/Time                        _____________________________________________                                                       Patient or Representative Signature         Date/Time        Patient Discharge   Nicholas House. / [id number] DOB: 12/17/1990   Admitted 04/21/2019 Discharged: 04/21/2019

## 2019-04-22 NOTE — Op Note (Signed)
NAME: Bernhard, Speak [facility] MEDICAL RECORD [id number] ACCOUNT [id number] DATE OF BIRTH:31-Jul-1991 FACILITY: [facility] LOCATION: [facility] PHYSICIAN:Lee-Ann Gal, MD  OPERATIVE REPORT  DATE OF PROCEDURE:  04/21/2019  PREOPERATIVE DIAGNOSIS:  Right testicular torsion.  POSTOPERATIVE DIAGNOSIS:  Right testicular torsion.  PROCEDURE: 1. Open right testicular detorsion. 2. Bilateral orchiopexy.  ESTIMATED BLOOD LOSS:  Nil.  COMPLICATIONS:  None.  SPECIMENS:  None.  DRAINS:  Penrose drain deep in the scrotum.  FINDINGS: 1. Three-revolution right testicular torsion. 2. Reestablishment of triphasic arterial flow following detorsion. 3. Successful bilateral orchiopexy lateral sulcus lateral.  INDICATIONS:  The patient is a very pleasant otherwise healthy 28 year old young man who was found on workup of acute right scrotal pain to have a high-riding right testis with 0 flow by Doppler ultrasound consistent with acute torsion.  Options were  discussed for management including recommended path of emergent surgery with detorsion and bilateral pexy, and he wished to proceed.  Informed consent was obtained and placed in the medical record.  PROCEDURE IN DETAIL:  The patient being identified and the procedure being right testicular detorsion and bilateral orchiopexy was confirmed.  Procedure timeout was performed.  Antibiotics were administered.  General endotracheal anesthesia was induced.   The patient was placed in the supine position.  A sterile field was created by prepping and draping the patient's penis, perineum and proximal thighs using iodine after clipper shaving.  Exam revealed a high-riding right testicle that was nonfixed.  An  incision was made along the median raphe of the scrotum approximately 3.5 cm proceeding into the right scrotal compartment.  There was a small reactive hydrocele.  The right testicle was then delivered.  It was very ischemic appearing and purple.  The   cord was clearly torsed and twisted.  It was detorsed for 3 revolutions.  This restored normal cord anatomy.  There was immediate partial resolution of the peripheral ischemic appearance with some pink mottling suggestive of restoration of flow.  The right testis  was then wrapped in warm saline gauze and set aside.  Next, an incision was carried through to the left hemiscrotum.  The left testicle was delivered.  It was palpably and visibly normal.  The appendix testis was ablated.  This was pexed in place with the  lateral sulcus lateral.  First at the meorchium testis and lateral leaflet using a 4-0 Prolene and then finally inferiorly, taking exquisite care to avoid skin perforation, thus performing a left orchiopexy.  Attention was redirected to the right side.   Doppler ultrasound was applied to the right testis.  There was restoration of triphasic arterial flow to the mesenteric side of the testis.  At the antimesenteric side, there was restoration of a single to biphasic flow.  It was clearly felt that  restoration of flow that orchiectomy was warranted on the right side.  As such, 2 separate 4-0 Prolene sutures were placed, 1 lateral, 1 inferior, pexing as per the left, and the right testis redelivered into the hemiscrotum.  Next, a small counter  incision was made in the left deep in the scrotum through which a 1/4-inch Penrose drain was placed so that it traversed both scrotal compartments.  This was anchored in place using 3-0 nylon.  The dartos was reapproximated using running Vicryl.  The  skin was reapproximated using running Monocryl.  The procedure was terminated.  The patient tolerated the procedure well.  No immediate complications.  The patient will remain in the OR as he is COVID  RNA positive.  He will recover here with plan for  discharge home and followup for drain removal in several days.     LN/NUANCE  D:04/21/2019 T:04/21/2019 JOB:[id number]

## 2020-04-21 IMAGING — US ULTRASOUND SCROTUM DOPPLER COMPLETE
1 series · 13 of 25 positions shown · non-contrast
Comparison: None.

CLINICAL DATA: Right testicular pain since this morning.

EXAM:
SCROTAL ULTRASOUND
DOPPLER ULTRASOUND OF THE TESTICLES
TECHNIQUE: Complete ultrasound examination of the testicles, epididymis, and
other scrotal structures was performed. Color and spectral Doppler
ultrasound were also utilized to evaluate blood flow to the
testicles.

[Series 1: ultrasound scrotum doppler complete · 13 of 65 slices shown]
[im 1/65]
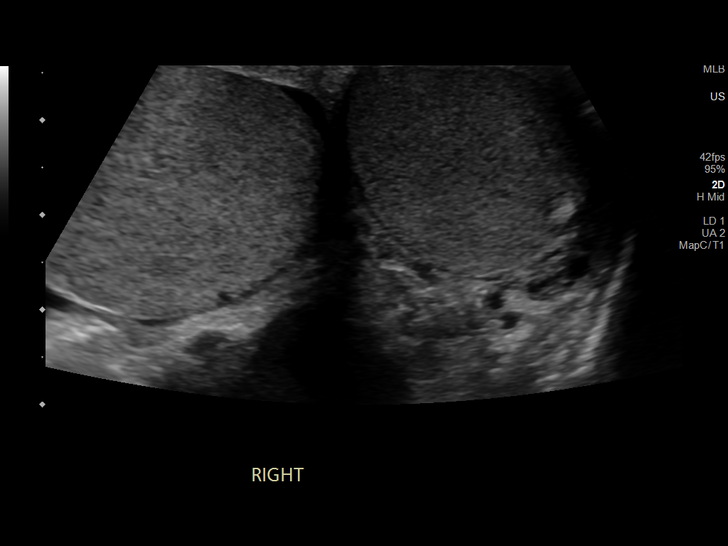
[im 6/65]
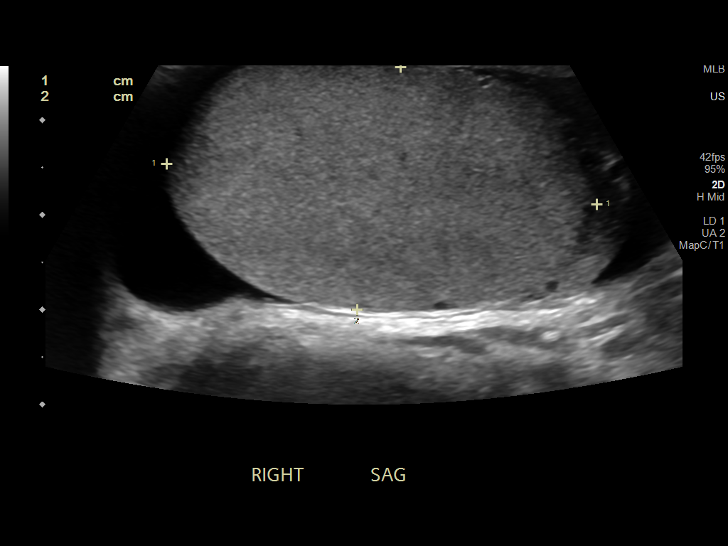
[im 11/65]
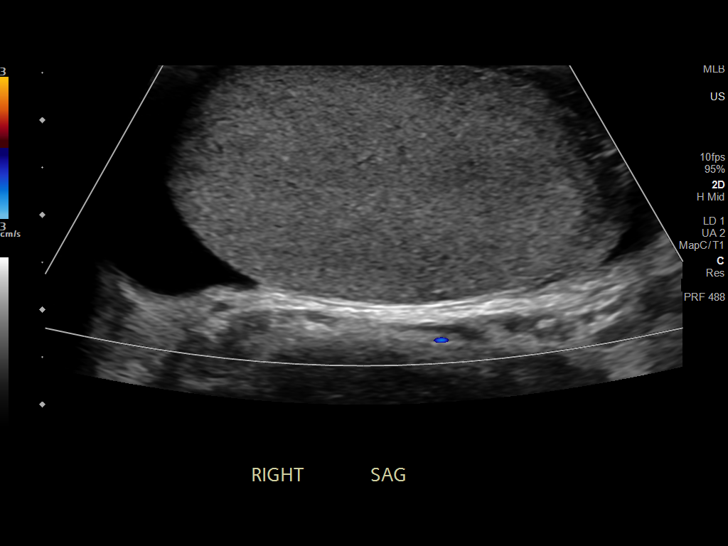
[im 17/65]
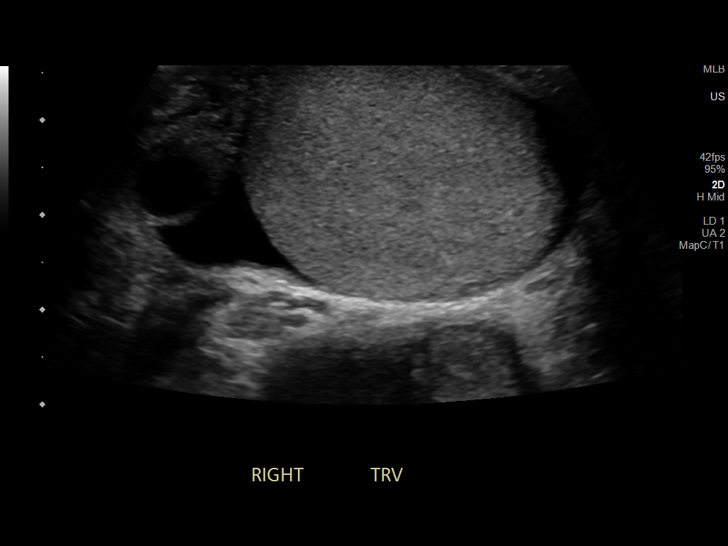
[im 22/65]
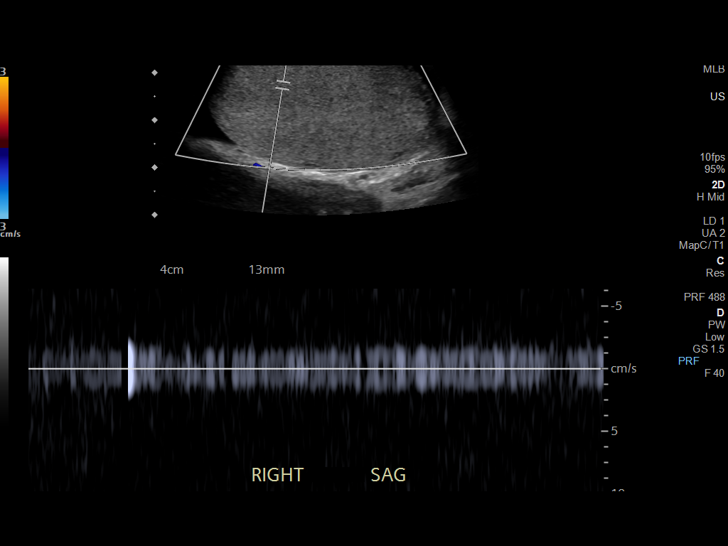
[im 27/65]
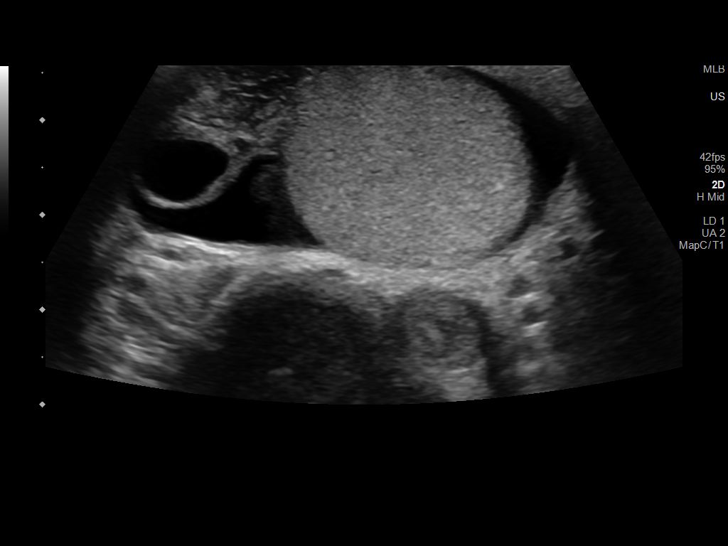
[im 33/65]
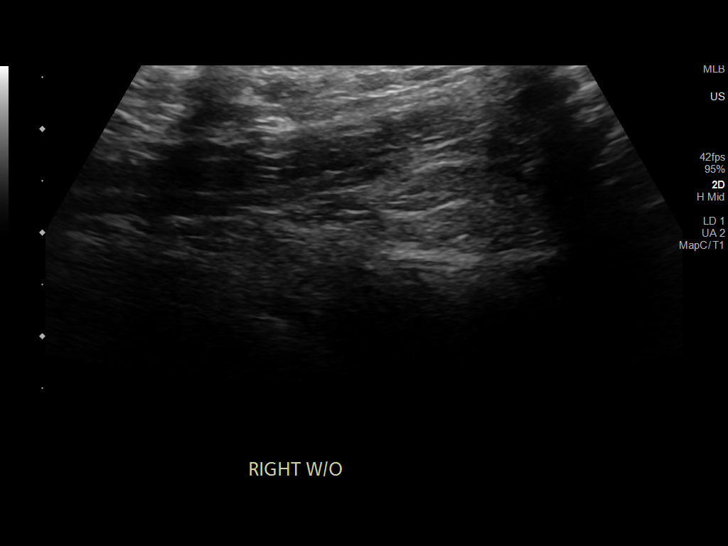
[im 38/65]
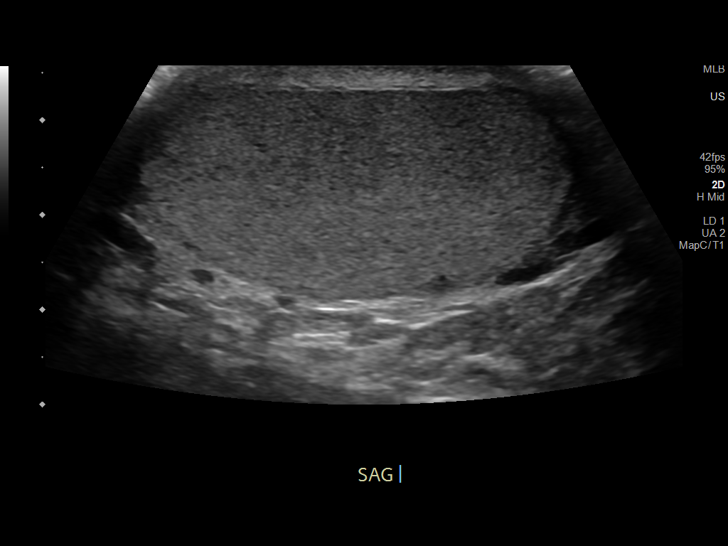
[im 43/65]
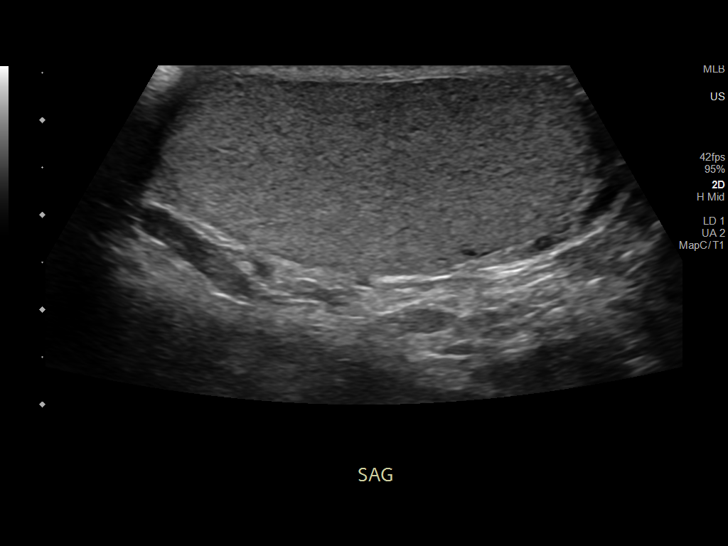
[im 49/65]
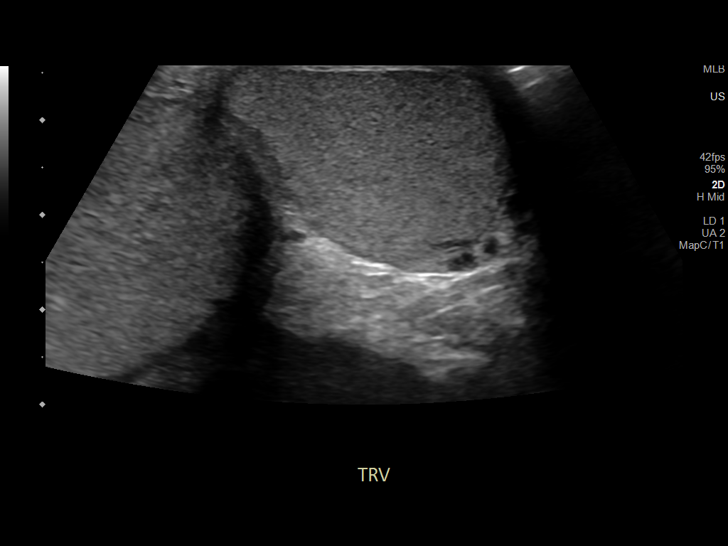
[im 54/65]
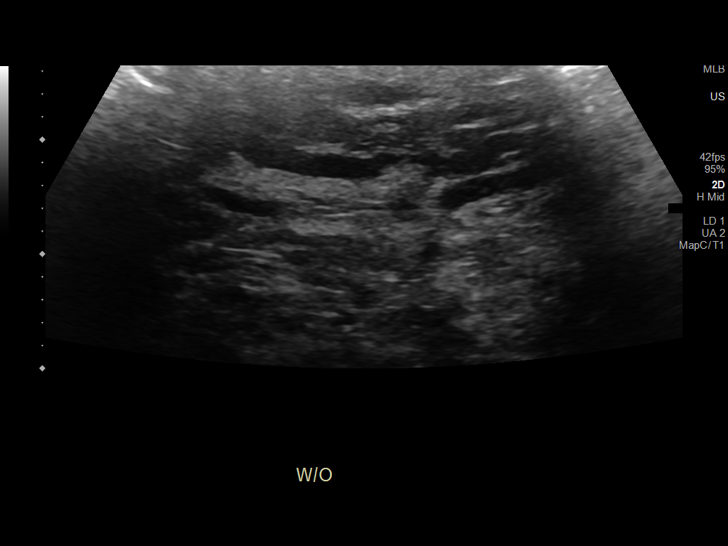
[im 59/65]
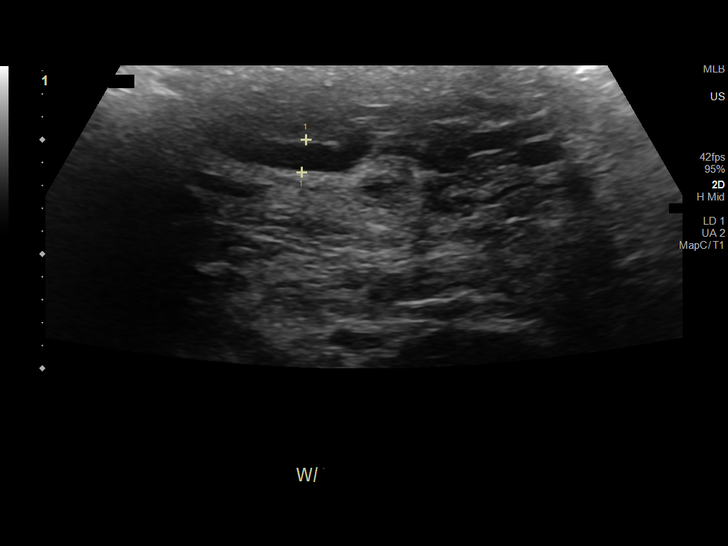
[im 65/65]
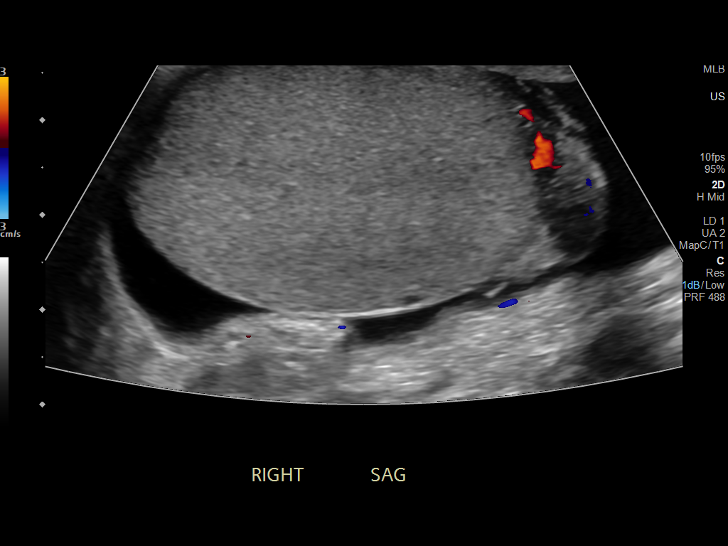

[13 of 25 positions shown; findings below may reference images not displayed]

FINDINGS: Right testicle

Measurements: 4.6 x 2.6 x 3.5 cm. No mass or microlithiasis
visualized. Echotexture relatively homogeneous. No flow signal
identified within the parenchyma on color Doppler evaluation. Pulsed
doppler evaluation reveals no arterial or venous waveform within the
testicular parenchyma.

Left testicle

Measurements: 4.5 x 2.0 x 3.0 cm. No mass or microlithiasis
visualized. Color signal evident on Doppler evaluation with arterial
and venous waveforms identified on pulsed doppler assessment.

Right epididymis: Enlarged with 9 mm epididymal cyst or
spermatocele.

Left epididymis:  Normal in size and appearance.

Hydrocele:  Small on the right.

Varicocele: Venous anatomy in the left hemiscrotum measures up to 3
mm diameter with Valsalva, suggesting varicocele.
IMPRESSION: 1. No color flow signal evident in the right testicle on Doppler
ultrasound. No arterial or venous waveform can be obtained within
the right testicular parenchyma on pulse Doppler evaluation. Imaging
features are compatible with right testicular torsion.
2. Borderline left varicocele.

Critical Value/emergent results were called by telephone at the time
of interpretation on 04/21/2019 at [DATE] to Dr. TAJAMMUL NIYAZI , who
verbally acknowledged these results.

## 2020-10-19 ENCOUNTER — Other Ambulatory Visit: Payer: Self-pay

## 2020-10-19 ENCOUNTER — Ambulatory Visit (HOSPITAL_COMMUNITY)
Admission: EM | Admit: 2020-10-19 | Discharge: 2020-10-19 | Disposition: A | Discharge status: Home / Self Care | Payer: Self-pay

## 2020-10-19 DIAGNOSIS — Z20822 Contact with and (suspected) exposure to covid-19: Secondary | ICD-10-CM

## 2020-10-20 LAB — NOVEL CORONAVIRUS, NAA: SARS-CoV-2, NAA: NOT DETECTED

## 2020-10-20 LAB — SARS-COV-2, NAA 2 DAY TAT

## 2020-12-06 ENCOUNTER — Ambulatory Visit: Payer: Self-pay

## 2020-12-06 ENCOUNTER — Other Ambulatory Visit: Payer: Self-pay

## 2020-12-06 ENCOUNTER — Emergency Department (HOSPITAL_COMMUNITY): Admission: EM | Admit: 2020-12-06 | Discharge: 2020-12-06 | Discharge status: Absent without leave | Payer: Self-pay

## 2020-12-06 NOTE — ED Triage Notes (Signed)
Called 3 times to triage, no answer 

## 2020-12-07 ENCOUNTER — Emergency Department (HOSPITAL_COMMUNITY)
Admission: EM | Admit: 2020-12-07 | Discharge: 2020-12-07 | Disposition: A | Discharge status: Home / Self Care | Payer: Commercial Managed Care - PPO | Attending: Emergency Medicine | Admitting: Emergency Medicine

## 2020-12-07 ENCOUNTER — Other Ambulatory Visit: Payer: Self-pay

## 2020-12-07 ENCOUNTER — Encounter (HOSPITAL_COMMUNITY): Payer: Self-pay | Admitting: *Deleted

## 2020-12-07 ENCOUNTER — Emergency Department (HOSPITAL_COMMUNITY): Payer: Commercial Managed Care - PPO

## 2020-12-07 DIAGNOSIS — R519 Headache, unspecified: Secondary | ICD-10-CM | POA: Diagnosis present

## 2020-12-07 DIAGNOSIS — Z91010 Allergy to peanuts: Secondary | ICD-10-CM | POA: Insufficient documentation

## 2020-12-07 DIAGNOSIS — K2970 Gastritis, unspecified, without bleeding: Secondary | ICD-10-CM | POA: Diagnosis not present

## 2020-12-07 DIAGNOSIS — U071 COVID-19: Secondary | ICD-10-CM | POA: Diagnosis not present

## 2020-12-07 DIAGNOSIS — F17290 Nicotine dependence, other tobacco product, uncomplicated: Secondary | ICD-10-CM | POA: Diagnosis not present

## 2020-12-07 LAB — RESP PANEL BY RT-PCR (FLU A&B, COVID) ARPGX2
Influenza A by PCR: NEGATIVE
Influenza B by PCR: NEGATIVE
SARS Coronavirus 2 by RT PCR: POSITIVE — AB

## 2020-12-07 LAB — URINALYSIS, ROUTINE W REFLEX MICROSCOPIC
Bilirubin Urine: NEGATIVE
Glucose, UA: NEGATIVE mg/dL
Hgb urine dipstick: NEGATIVE
Ketones, ur: NEGATIVE mg/dL
Leukocytes,Ua: NEGATIVE
Nitrite: NEGATIVE
Protein, ur: NEGATIVE mg/dL
Specific Gravity, Urine: 1.013 (ref 1.005–1.030)
pH: 6 (ref 5.0–8.0)

## 2020-12-07 LAB — COMPREHENSIVE METABOLIC PANEL
ALT: 43 U/L (ref 0–44)
AST: 46 U/L — ABNORMAL HIGH (ref 15–41)
Albumin: 3.8 g/dL (ref 3.5–5.0)
Alkaline Phosphatase: 51 U/L (ref 38–126)
Anion gap: 8 (ref 5–15)
BUN: 9 mg/dL (ref 6–20)
CO2: 26 mmol/L (ref 22–32)
Calcium: 8.8 mg/dL — ABNORMAL LOW (ref 8.9–10.3)
Chloride: 105 mmol/L (ref 98–111)
Creatinine, Ser: 1.19 mg/dL (ref 0.61–1.24)
GFR, Estimated: >60 mL/min (ref >60)
Glucose, Bld: 105 mg/dL — ABNORMAL HIGH (ref 70–99)
Potassium: 4.2 mmol/L (ref 3.5–5.1)
Sodium: 139 mmol/L (ref 135–145)
Total Bilirubin: 0.8 mg/dL (ref 0.3–1.2)
Total Protein: 6.7 g/dL (ref 6.5–8.1)

## 2020-12-07 LAB — CBC
HCT: 43.9 % (ref 39.0–52.0)
Hemoglobin: 14 g/dL (ref 13.0–17.0)
MCH: 27.6 pg (ref 26.0–34.0)
MCHC: 31.9 g/dL (ref 30.0–36.0)
MCV: 86.4 fL (ref 80.0–100.0)
Platelets: 223 10*3/uL (ref 150–400)
RBC: 5.08 MIL/uL (ref 4.22–5.81)
RDW: 13 % (ref 11.5–15.5)
WBC: 6.6 10*3/uL (ref 4.0–10.5)
nRBC: 0 % (ref 0.0–0.2)

## 2020-12-07 LAB — LIPASE, BLOOD: Lipase: 64 U/L — ABNORMAL HIGH (ref 11–51)

## 2020-12-07 MED ORDER — LIDOCAINE VISCOUS HCL 2 % MT SOLN
15.0000 mL | Freq: Once | OROMUCOSAL | Status: AC
  Administered 2020-12-07: 15 mL via ORAL
  Filled 2020-12-07: qty 15

## 2020-12-07 MED ORDER — ONDANSETRON 4 MG PO TBDP
4.0000 mg | ORAL_TABLET | Freq: Once | ORAL | Status: AC
  Administered 2020-12-07: 4 mg via ORAL
  Filled 2020-12-07: qty 1

## 2020-12-07 MED ORDER — FAMOTIDINE 20 MG PO TABS: 20.0000 mg | ORAL_TABLET | Freq: Two times a day (BID) | ORAL | 0 refills | Status: DC

## 2020-12-07 MED ORDER — IBUPROFEN 400 MG PO TABS
600.0000 mg | ORAL_TABLET | Freq: Once | ORAL | Status: AC
  Administered 2020-12-07: 600 mg via ORAL
  Filled 2020-12-07: qty 1

## 2020-12-07 MED ORDER — ALUM & MAG HYDROXIDE-SIMETH 200-200-20 MG/5ML PO SUSP
30.0000 mL | Freq: Once | ORAL | Status: AC
  Administered 2020-12-07: 30 mL via ORAL
  Filled 2020-12-07: qty 30

## 2020-12-07 NOTE — Discharge Instructions (Addendum)
You tested positive for COVID-19 today. You will need to quarantine for 7 to 10 days. You may use over-the-counter Tylenol/ibuprofen, cold or flu medicines. You may buy an over-the-counter pulse oximeter which measures the percent of oxygen your blood and is placed on your finger. This can be found at any local pharmacy. Return to the ER if your oxygen is consistently below 90% or if you have worsening shortness of breath.   Your vomiting in the mornings may be due to stomach gastritis. Please take the medicine Pepcid that I prescribed to see if this alleviates your symptoms. Please follow-up with [facility] which is a free clinic in the area.  Return to the ER for any new or worsening symptoms.

## 2020-12-07 NOTE — ED Provider Notes (Signed)
[facility] EMERGENCY DEPARTMENT Provider Note   CSN: [id number] Arrival date & time: 12/07/20  0606     History Chief Complaint  Patient presents with  . Headache    Nicholas House. is a 30 y.o. male.  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  Patient states that he has had ongoing vomiting in the mornings either before or after he brushes his teeth.  He will notice some specks of blood in his vomit at times.  He will endorse some abdominal discomfort, but no pain.  He states that he then goes on with the rest of his day being able to eat and drink with no other episodes of vomiting.  Vomiting is isolated to just the mornings.  He denies any measured fevers but he does get chills at times.  States he will have episodes of loose stool every now and then.  No blood.  Has not taking anything for his symptoms.  No significant alcohol use, no drug use.  Does not take NSAIDs frequently.  He is vaccinated for Covid but not boosted.  Currently endorses some nausea but no abdominal pain.  Also endorses intermittent headaches, no vision changes, neck stiffness.  He reports he was seen at urgent care once for this, was told to take MiraLAX which he states he started to, but it has not been helping so he stopped taking it.    History reviewed. No pertinent past medical history.  Patient Active Problem List   Diagnosis Date Noted  . 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 ORCHIOPEXY Bilateral 04/21/2019   Procedure: Open right detorsion and bilateral orchiopexy;  Surgeon: Sebastian Ache, MD;  Location: [facility] OR;  Service: Urology;  Laterality: Bilateral;       No family history on file.  Social History   Tobacco Use  . Smoking status: Current Some Day Smoker    Types: Cigarettes  . Smokeless tobacco:  Never Used  Substance Use Topics  . Alcohol use: Yes  . Drug use: Yes    Types: Marijuana    Home Medications Prior to Admission medications   Medication Sig Start Date End Date Taking? Authorizing Provider  famotidine (PEPCID) 20 MG tablet Take 1 tablet (20 mg total) by mouth 2 (two) times daily. 12/07/20  Yes Mare Ferrari, PA-C  senna-docusate (SENOKOT-S) 8.6-50 MG tablet Take 1 tablet by mouth 2 (two) times daily. While taking strong pain meds to prevent consipation 04/21/19   Sebastian Ache, MD    Allergies    Peanut-containing drug products  Review of Systems   Review of Systems  Constitutional: Negative for chills and fever.  HENT: Negative for ear pain and sore throat.   Eyes: Negative for pain and visual disturbance.  Respiratory: Negative for cough and shortness of breath.   Cardiovascular: Negative for chest pain and palpitations.  Gastrointestinal: Negative for abdominal pain and vomiting.  Genitourinary: Negative for dysuria and hematuria.  Musculoskeletal: Negative for arthralgias and back pain.  Skin: Negative for color change and rash.  Neurological: Negative for seizures and syncope.  All other systems reviewed and are negative.   Physical Exam Updated Vital Signs BP (!) 129/92 (BP Location: Left Arm)   Pulse 60   Temp 98.7 F (37.1 C) (Oral)   Resp 16   SpO2 100%  Physical Exam Vitals and nursing note reviewed.  Constitutional:      General: He is not in acute distress.    Appearance: He is well-developed and well-nourished. He is not ill-appearing, toxic-appearing or diaphoretic.  HENT:     Head: Normocephalic and atraumatic.  Eyes:     Conjunctiva/sclera: Conjunctivae normal.  Cardiovascular:     Rate and Rhythm: Normal rate and regular rhythm.     Heart sounds: No murmur heard.   Pulmonary:     Effort: Pulmonary effort is normal. No respiratory distress.     Breath sounds: Normal breath sounds.  Abdominal:     Palpations: Abdomen is  soft. There is no mass.     Tenderness: There is no abdominal tenderness. There is no guarding.  Musculoskeletal:        General: No edema. Normal range of motion.     Cervical back: Neck supple.     Comments: Reports "feeling weird" with palpation but no pain   Skin:    General: Skin is warm and dry.  Neurological:     Mental Status: He is alert.  Psychiatric:        Mood and Affect: Mood and affect and mood normal.        Behavior: Behavior normal.     ED Results / Procedures / Treatments   Labs (all labs ordered are listed, but only abnormal results are displayed) Labs Reviewed  RESP PANEL BY RT-PCR (FLU A&B, COVID) ARPGX2 - Abnormal; Notable for the following components:      Result Value   SARS Coronavirus 2 by RT PCR POSITIVE (*)    All other components within normal limits  LIPASE, BLOOD - Abnormal; Notable for the following components:   Lipase 64 (*)    All other components within normal limits  COMPREHENSIVE METABOLIC PANEL - Abnormal; Notable for the following components:   Glucose, Bld 105 (*)    Calcium 8.8 (*)    AST 46 (*)    All other components within normal limits  CBC  URINALYSIS, ROUTINE W REFLEX MICROSCOPIC    EKG None  Radiology CT Head Wo Contrast  Result Date: 12/07/2020 CLINICAL DATA:  Chronic headache with vomiting EXAM: CT HEAD WITHOUT CONTRAST TECHNIQUE: Contiguous axial images were obtained from the base of the skull through the vertex without intravenous contrast. COMPARISON:  None. FINDINGS: Brain: Ventricles and sulci are normal in size and configuration. There is no intracranial mass, hemorrhage, extra-axial fluid collection, or midline shift. The brain parenchyma appears unremarkable. There is no demonstrable acute infarct. Vascular: No hyperdense vessel.  No evident vascular calcification. Skull: Bony calvarium appears intact. Sinuses/Orbits: There is opacification and mucosal thickening in several ethmoid air cells. Visualized orbits appear  symmetric bilaterally. Other: Mastoid air cells are clear. IMPRESSION: Areas of ethmoid sinus disease.  Study otherwise unremarkable. Electronically Signed   By: Bretta Bang III M.D.   On: 12/07/2020 12:36    Procedures Procedures (including critical care time)  Medications Ordered in ED Medications  alum & mag hydroxide-simeth (MAALOX/MYLANTA) 200-200-20 MG/5ML suspension 30 mL (30 mLs Oral Given 12/07/20 1152)    And  lidocaine (XYLOCAINE) 2 % viscous mouth solution 15 mL (15 mLs Oral Given 12/07/20 1152)  ibuprofen (ADVIL) tablet 600 mg (600 mg Oral Given 12/07/20 1152)  ondansetron (ZOFRAN-ODT) disintegrating tablet 4 mg (4 mg Oral Given 12/07/20 1152)    ED Course  I have reviewed the triage vital signs and the nursing notes.  Pertinent labs &  imaging results that were available during my care of the patient were reviewed by me and considered in my medical decision making (see chart for details).    MDM Rules/Calculators/A&P                         30 year old male with 2 months of morning emesis, feeling feverish, headaches. Vitals on arrival overall reassuring. Afebrile. Physical exam with no gross neurologic abnormalities, no focal abdominal tenderness noted. Lung sounds clear. His CBC largely unremarkable, CMP with no electrolyte abnormalities, AST mildly elevated at 46. Mildly elevated lipase of 64 but not consistent with pancreatitis. Patient is denying any history of IV drug use or alcohol. UA without evidence of UTI. Covid test is pending. Patient showed me a photo of his emesis in the morning, very small specks of blood noted, however no significant bleeding. Low suspicion for GI bleed or hematemesis. CBC with normal hemoglobin.  Given 2 months of emesis in the setting of headache, there is concern for possible malignancy. I ordered a CT of the head which did not show any abnormalities. His Covid test is positive.  Suspect symptoms are secondary to COVID-19, possible  GERD/gastritis. Patient was educated on quarantine., Supportive care. Will send home with Pepcid. Treated here with GI cocktail, Zofran, ibuprofen for headache. Return precautions discussed. Will refer to [facility] for further follow-up. He voiced understanding and is agreeable. At this stage in the ED course, the patient is medically screened stable for discharge.   Final Clinical Impression(s) / ED Diagnoses Final diagnoses:  COVID-19  Gastritis without bleeding, unspecified chronicity, unspecified gastritis type    Rx / DC Orders ED Discharge Orders         Ordered    famotidine (PEPCID) 20 MG tablet  2 times daily        12/07/20 1241           Lyndel Safe 12/07/20 1252    Isla Pence, MD 12/07/20 1348

## 2020-12-07 NOTE — ED Triage Notes (Signed)
Pt states that for two weeks, he has had vomiting in the mornings when he wakes up, looks like it has blood in it he reports. Headache and possible fevers.

## 2021-01-12 ENCOUNTER — Ambulatory Visit: Payer: Self-pay

## 2021-02-13 ENCOUNTER — Ambulatory Visit: Payer: Self-pay

## 2021-12-08 IMAGING — CT CT HEAD W/O CM
4 series · 16 of 47 positions shown, 18 images · non-contrast
Comparison: None.

CLINICAL DATA: Chronic headache with vomiting

EXAM:
CT HEAD WITHOUT CONTRAST
TECHNIQUE: Contiguous axial images were obtained from the base of the skull
through the vertex without intravenous contrast.

[Series 3: head wo · axial · 0.45mm/px · z∈[-122,-12]mm · 7 of 30 slices shown, 9 images]
[im 4/30  brain]
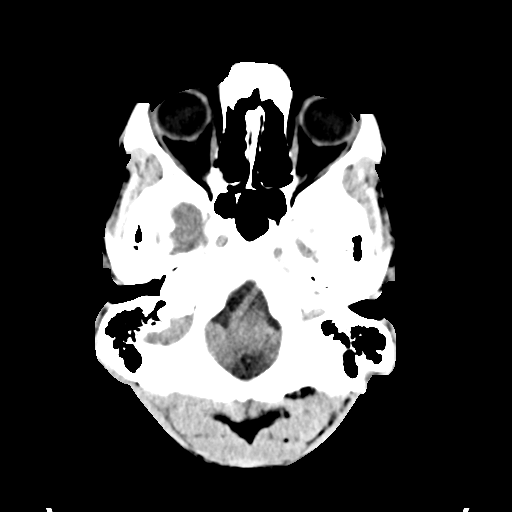
[im 4/30  bone]
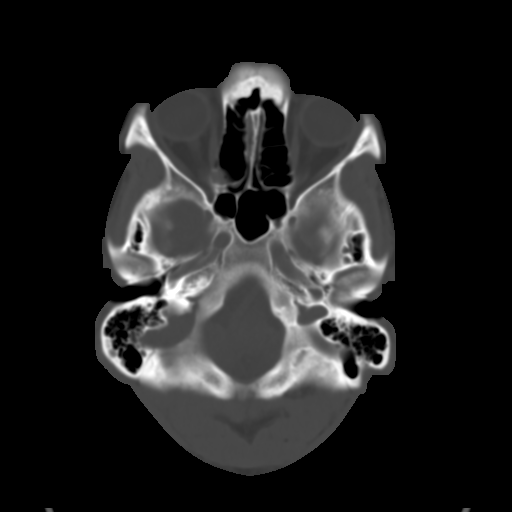
[im 8/30  brain]
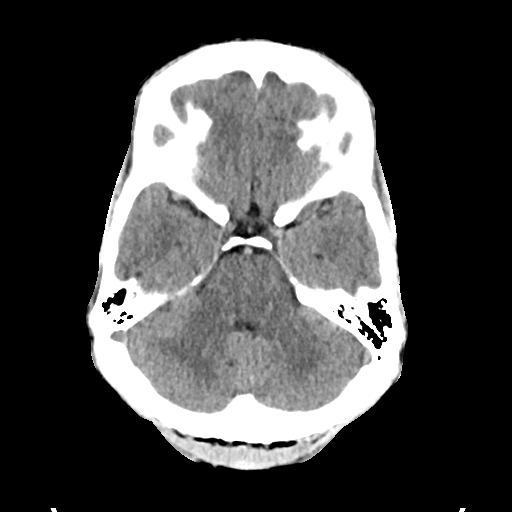
[im 11/30  brain]
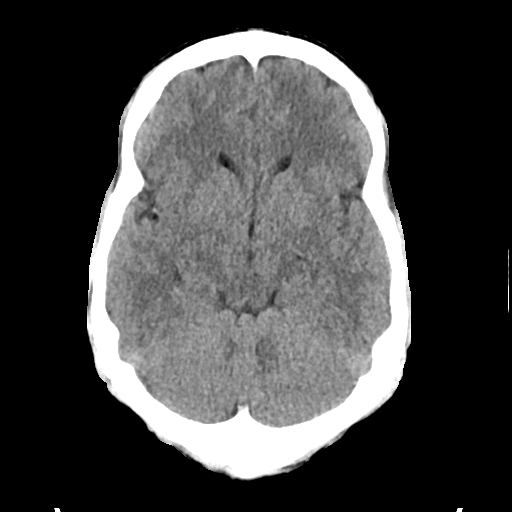
[im 15/30  brain]
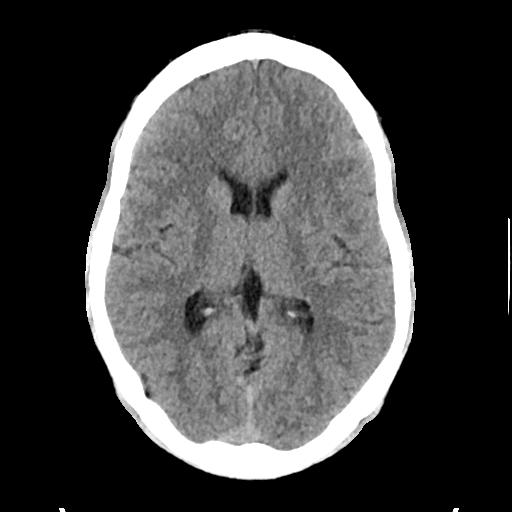
[im 19/30  brain]
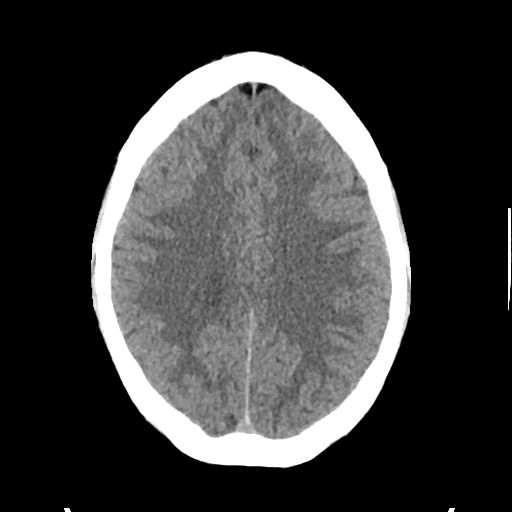
[im 19/30  bone]
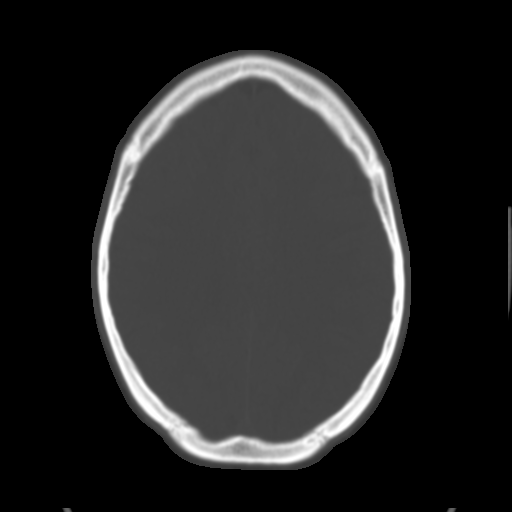
[im 22/30  brain]
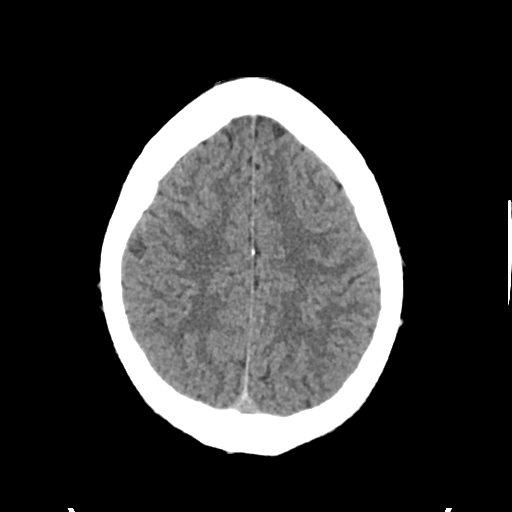
[im 26/30  brain]
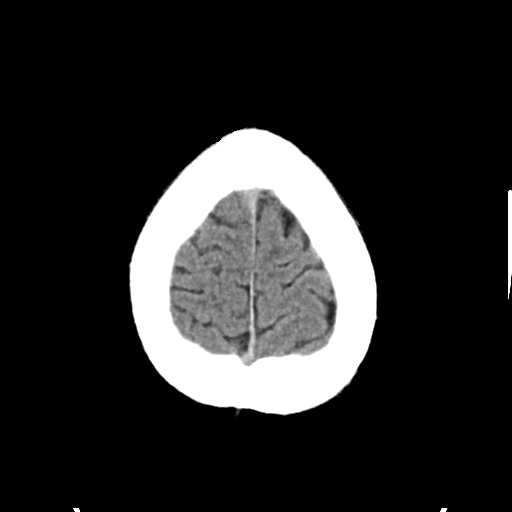

[Series 4: head bone · axial · 0.45mm/px · z∈[-124,-94]mm · 3 of 75 slices shown]
[im 8/75  bone]
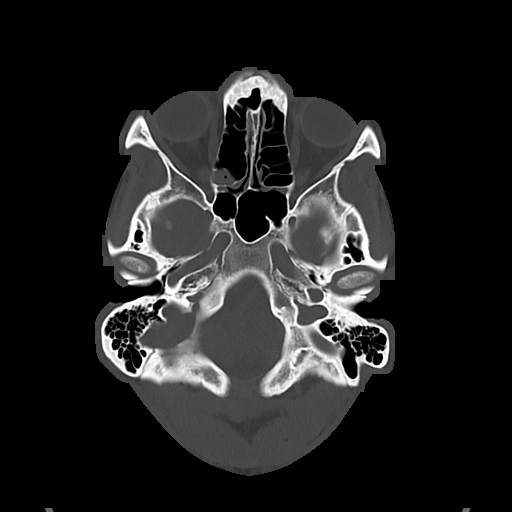
[im 15/75  bone]
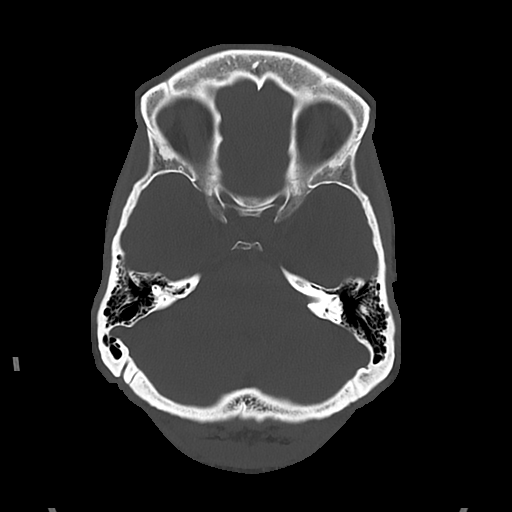
[im 23/75  bone]
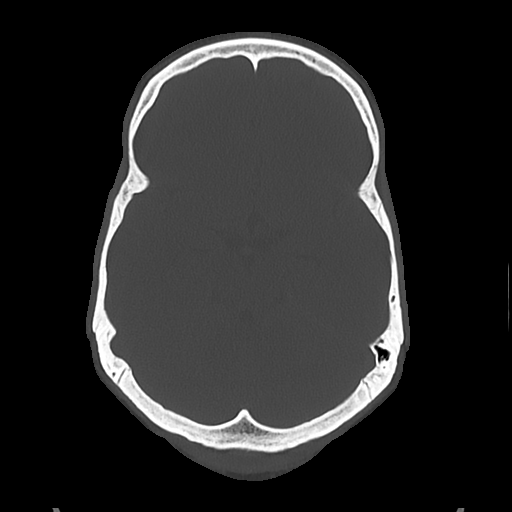

[Series 5: cor soft · coronal · 0.33mm/px · 3 of 70 slices shown]
[im 24/70  brain]
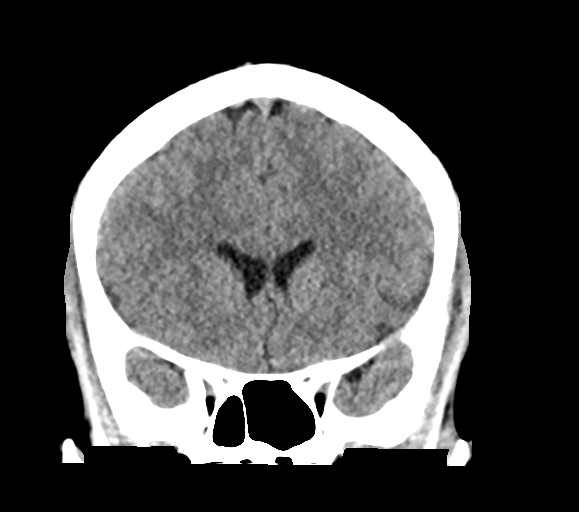
[im 31/70  brain]
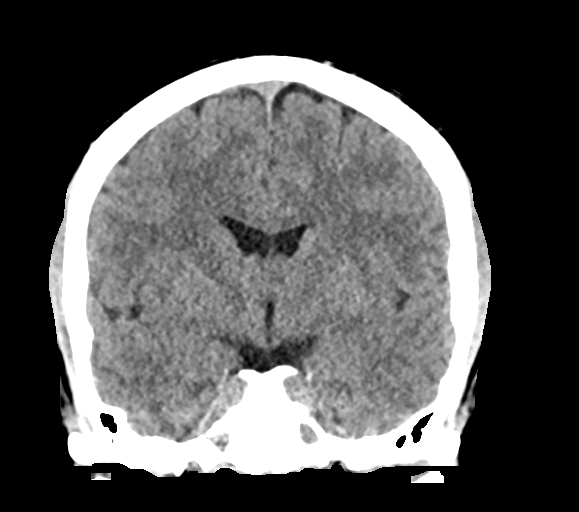
[im 39/70  brain]
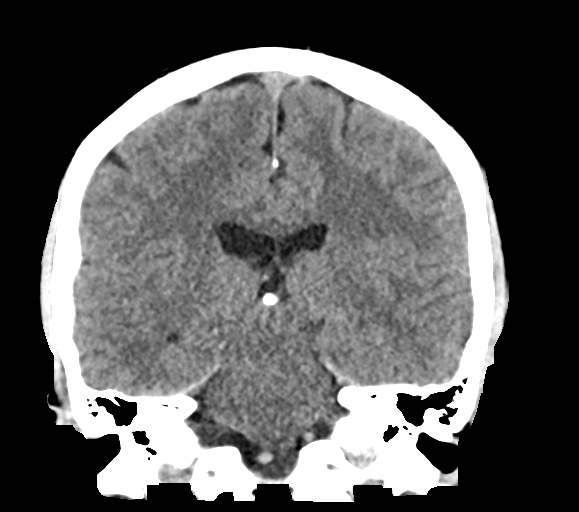

[Series 6: sag soft · sagittal · 0.35mm/px · 3 of 56 slices shown]
[im 19/56  brain]
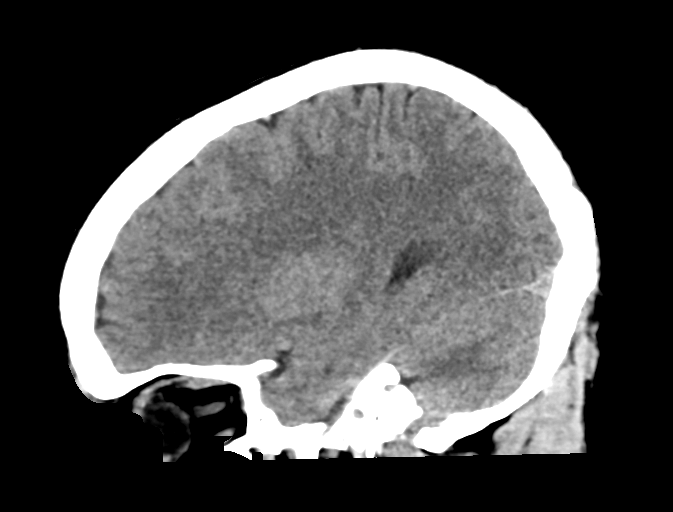
[im 28/56  brain]
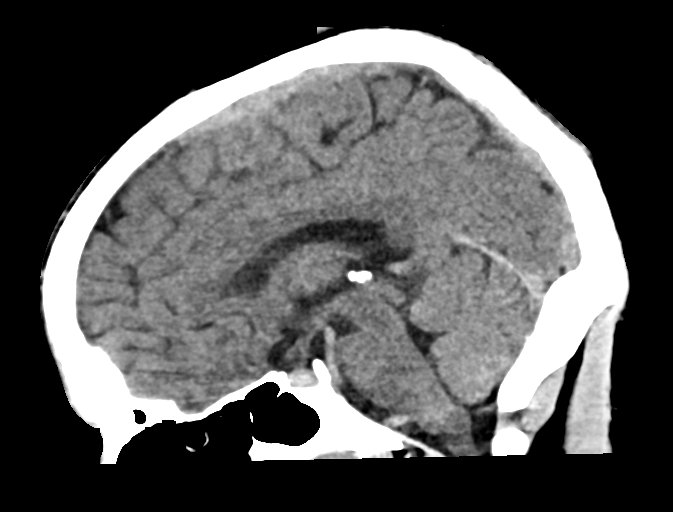
[im 37/56  brain]
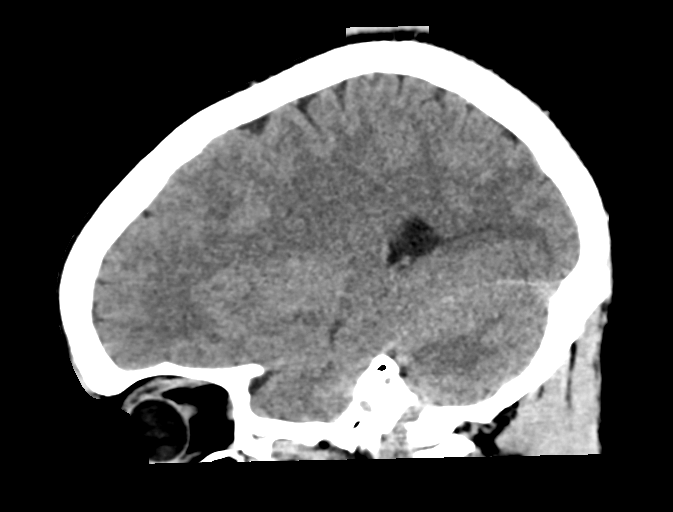

[16 of 47 positions shown; findings below may reference images not displayed]

FINDINGS: Brain: Ventricles and sulci are normal in size and configuration.
There is no intracranial mass, hemorrhage, extra-axial fluid
collection, or midline shift. The brain parenchyma appears
unremarkable. There is no demonstrable acute infarct.

Vascular: No hyperdense vessel.  No evident vascular calcification.

Skull: Bony calvarium appears intact.

Sinuses/Orbits: There is opacification and mucosal thickening in
several ethmoid air cells. Visualized orbits appear symmetric
bilaterally.

Other: Mastoid air cells are clear.
IMPRESSION: Areas of ethmoid sinus disease.  Study otherwise unremarkable.

## 2022-07-10 ENCOUNTER — Other Ambulatory Visit: Payer: Self-pay

## 2022-07-10 ENCOUNTER — Emergency Department (HOSPITAL_COMMUNITY): Payer: Self-pay

## 2022-07-10 ENCOUNTER — Emergency Department (HOSPITAL_COMMUNITY)
Admission: EM | Admit: 2022-07-10 | Discharge: 2022-07-10 | Disposition: A | Discharge status: Home / Self Care | Payer: Self-pay | Attending: Emergency Medicine | Admitting: Emergency Medicine

## 2022-07-10 ENCOUNTER — Encounter (HOSPITAL_COMMUNITY): Payer: Self-pay

## 2022-07-10 DIAGNOSIS — R112 Nausea with vomiting, unspecified: Secondary | ICD-10-CM | POA: Insufficient documentation

## 2022-07-10 DIAGNOSIS — R1084 Generalized abdominal pain: Secondary | ICD-10-CM | POA: Insufficient documentation

## 2022-07-10 DIAGNOSIS — M545 Low back pain, unspecified: Secondary | ICD-10-CM | POA: Insufficient documentation

## 2022-07-10 DIAGNOSIS — Z91010 Allergy to peanuts: Secondary | ICD-10-CM | POA: Insufficient documentation

## 2022-07-10 DIAGNOSIS — R197 Diarrhea, unspecified: Secondary | ICD-10-CM | POA: Insufficient documentation

## 2022-07-10 DIAGNOSIS — R0602 Shortness of breath: Secondary | ICD-10-CM | POA: Insufficient documentation

## 2022-07-10 LAB — CBC WITH DIFFERENTIAL/PLATELET
Abs Immature Granulocytes: 0.02 10*3/uL (ref 0.00–0.07)
Basophils Absolute: 0 10*3/uL (ref 0.0–0.1)
Basophils Relative: 0 %
Eosinophils Absolute: 0 10*3/uL (ref 0.0–0.5)
Eosinophils Relative: 0 %
HCT: 46.4 % (ref 39.0–52.0)
Hemoglobin: 15.8 g/dL (ref 13.0–17.0)
Immature Granulocytes: 0 %
Lymphocytes Relative: 3 %
Lymphs Abs: 0.3 10*3/uL — ABNORMAL LOW (ref 0.7–4.0)
MCH: 28.2 pg (ref 26.0–34.0)
MCHC: 34.1 g/dL (ref 30.0–36.0)
MCV: 82.9 fL (ref 80.0–100.0)
Monocytes Absolute: 0.6 10*3/uL (ref 0.1–1.0)
Monocytes Relative: 5 %
Neutro Abs: 11.2 10*3/uL — ABNORMAL HIGH (ref 1.7–7.7)
Neutrophils Relative %: 92 %
Platelets: 232 10*3/uL (ref 150–400)
RBC: 5.6 MIL/uL (ref 4.22–5.81)
RDW: 12.8 % (ref 11.5–15.5)
WBC: 12.2 10*3/uL — ABNORMAL HIGH (ref 4.0–10.5)
nRBC: 0 % (ref 0.0–0.2)

## 2022-07-10 LAB — COMPREHENSIVE METABOLIC PANEL
ALT: 15 U/L (ref 0–44)
AST: 25 U/L (ref 15–41)
Albumin: 4.8 g/dL (ref 3.5–5.0)
Alkaline Phosphatase: 53 U/L (ref 38–126)
Anion gap: 13 (ref 5–15)
BUN: 12 mg/dL (ref 6–20)
CO2: 19 mmol/L — ABNORMAL LOW (ref 22–32)
Calcium: 9.7 mg/dL (ref 8.9–10.3)
Chloride: 108 mmol/L (ref 98–111)
Creatinine, Ser: 1.32 mg/dL — ABNORMAL HIGH (ref 0.61–1.24)
GFR, Estimated: >60 mL/min (ref >60)
Glucose, Bld: 118 mg/dL — ABNORMAL HIGH (ref 70–99)
Potassium: 3.7 mmol/L (ref 3.5–5.1)
Sodium: 140 mmol/L (ref 135–145)
Total Bilirubin: 1.8 mg/dL — ABNORMAL HIGH (ref 0.3–1.2)
Total Protein: 8.3 g/dL — ABNORMAL HIGH (ref 6.5–8.1)

## 2022-07-10 LAB — LIPASE, BLOOD: Lipase: 25 U/L (ref 11–51)

## 2022-07-10 MED ORDER — ALBUTEROL SULFATE HFA 108 (90 BASE) MCG/ACT IN AERS: 2.0000 | INHALATION_SPRAY | RESPIRATORY_TRACT | Status: DC | PRN

## 2022-07-10 MED ORDER — ONDANSETRON HCL 4 MG PO TABS: 4.0000 mg | ORAL_TABLET | Freq: Three times a day (TID) | ORAL | 0 refills | Status: AC | PRN

## 2022-07-10 MED ORDER — IOHEXOL 300 MG/ML  SOLN
100.0000 mL | Freq: Once | INTRAMUSCULAR | Status: AC | PRN
  Administered 2022-07-10: 100 mL via INTRAVENOUS

## 2022-07-10 MED ORDER — SODIUM CHLORIDE 0.9 % IV BOLUS
1000.0000 mL | Freq: Once | INTRAVENOUS | Status: AC
  Administered 2022-07-10: 1000 mL via INTRAVENOUS

## 2022-07-10 MED ORDER — ONDANSETRON HCL 4 MG/2ML IJ SOLN
4.0000 mg | Freq: Once | INTRAMUSCULAR | Status: AC
  Administered 2022-07-10: 4 mg via INTRAVENOUS
  Filled 2022-07-10: qty 2

## 2022-07-10 MED ORDER — FENTANYL CITRATE PF 50 MCG/ML IJ SOSY
75.0000 ug | PREFILLED_SYRINGE | Freq: Once | INTRAMUSCULAR | Status: AC
  Administered 2022-07-10: 75 ug via INTRAVENOUS
  Filled 2022-07-10: qty 2

## 2022-07-10 MED ORDER — SODIUM CHLORIDE (PF) 0.9 % IJ SOLN
INTRAMUSCULAR | Status: AC
  Filled 2022-07-10: qty 50

## 2022-07-10 MED ORDER — HYDROCODONE-ACETAMINOPHEN 5-325 MG PO TABS: 1.0000 | ORAL_TABLET | ORAL | 0 refills | Status: AC | PRN

## 2022-07-10 NOTE — Discharge Instructions (Addendum)
You were seen today for abdominal pain, nausea, vomiting, diarrhea.  This is consistent with infectious diarrhea or colitis.  This is probably a self-limited illness.  Please be sure to rehydrate orally with water.  I have prescribed nausea medication and a short course of pain medication to be used as needed.  Use Imodium if needed over-the-counter.  You may follow-up with gastroenterology if symptoms persist

## 2022-07-10 NOTE — ED Provider Notes (Signed)
[facility]EMERGENCY DEPT Provider Note   CSN: [id number] Arrival date & time: 07/10/22  1216     History  Chief Complaint  Patient presents with   Shortness of Breath   Diarrhea   Abdominal Pain   Emesis    Nicholas House. is a 31 y.o. male.  Patient presents to the hospital complaining of abdominal pain, nausea, vomiting, shortness of breath, and bilateral low back pain which began this morning at 5 AM.  Patient states he had no symptoms at all yesterday.  He states he normally wakes up at 5 AM and that he woke up with the pain.  The pain is currently located in the right lower quadrant.  He describes as sharp and rates at 9 out of 10 in severity.  He states he noticed bilateral low back pain at the same time.  Nausea and vomiting began simultaneously.  He does endorse 1 course of diarrhea.  The patient has history of orchiopexy and testicular torsion.  No other past medical history  HPI     Home Medications Prior to Admission medications   Medication Sig Start Date End Date Taking? Authorizing Provider  acetaminophen (TYLENOL) 500 MG tablet Take 500 mg by mouth every 6 (six) hours as needed for mild pain.   Yes [provider]  famotidine (PEPCID) 20 MG tablet Take 1 tablet (20 mg total) by mouth 2 (two) times daily. Patient not taking: Reported on 07/10/2022 12/07/20   Mare Ferrari, PA-C  HYDROcodone-acetaminophen (NORCO/VICODIN) 5-325 MG tablet Take 1 tablet by mouth every 4 (four) hours as needed. 07/10/22  Yes Barrie Dunker B, PA-C  ondansetron (ZOFRAN) 4 MG tablet Take 1 tablet (4 mg total) by mouth every 8 (eight) hours as needed for nausea or vomiting. 07/10/22  Yes Darrick Grinder, PA-C  senna-docusate (SENOKOT-S) 8.6-50 MG tablet Take 1 tablet by mouth 2 (two) times daily. While taking strong pain meds to prevent consipation Patient not taking: Reported on 07/10/2022 04/21/19   Sebastian Ache, MD      Allergies    Peanut-containing  drug products    Review of Systems   Review of Systems  Constitutional:  Positive for chills.  Respiratory:  Positive for shortness of breath.   Cardiovascular:  Negative for chest pain.  Gastrointestinal:  Positive for abdominal pain, diarrhea, nausea and vomiting.  Genitourinary:  Positive for flank pain. Negative for dysuria.  Musculoskeletal:  Positive for back pain.    Physical Exam Updated Vital Signs BP 130/87   Pulse 95   Temp 98.4 F (36.9 C) (Oral)   Resp 11   Ht 6\' 4"  (1.93 m)   Wt 86.2 kg   SpO2 100%   BMI 23.13 kg/m  Physical Exam Vitals and nursing note reviewed.  Constitutional:      Appearance: He is normal weight.     Comments: Patient appears uncomfortable  HENT:     Head: Normocephalic and atraumatic.  Eyes:     Extraocular Movements: Extraocular movements intact.  Cardiovascular:     Rate and Rhythm: Normal rate and regular rhythm.     Heart sounds: Normal heart sounds.  Pulmonary:     Effort: Pulmonary effort is normal.     Breath sounds: Normal breath sounds.  Abdominal:     General: Abdomen is flat. Bowel sounds are normal. There is no distension.     Palpations: Abdomen is soft.     Tenderness: There is abdominal tenderness in the  right lower quadrant. There is right CVA tenderness and left CVA tenderness.  Skin:    General: Skin is warm and dry.     Capillary Refill: Capillary refill takes less than 2 seconds.  Neurological:     Mental Status: He is alert.     ED Results / Procedures / Treatments   Labs (all labs ordered are listed, but only abnormal results are displayed) Labs Reviewed  COMPREHENSIVE METABOLIC PANEL - Abnormal; Notable for the following components:      Result Value   CO2 19 (*)    Glucose, Bld 118 (*)    Creatinine, Ser 1.32 (*)    Total Protein 8.3 (*)    Total Bilirubin 1.8 (*)    All other components within normal limits  CBC WITH DIFFERENTIAL/PLATELET - Abnormal; Notable for the following components:   WBC  12.2 (*)    Neutro Abs 11.2 (*)    Lymphs Abs 0.3 (*)    All other components within normal limits  LIPASE, BLOOD  URINALYSIS, ROUTINE W REFLEX MICROSCOPIC    EKG None  Radiology CT Abdomen Pelvis W Contrast  Result Date: 07/10/2022 CLINICAL DATA:  Pain right lower quadrant of abdomen EXAM: CT ABDOMEN AND PELVIS WITH CONTRAST TECHNIQUE: Multidetector CT imaging of the abdomen and pelvis was performed using the standard protocol following bolus administration of intravenous contrast. RADIATION DOSE REDUCTION: This exam was performed according to the departmental dose-optimization program which includes automated exposure control, adjustment of the mA and/or kV according to patient size and/or use of iterative reconstruction technique. CONTRAST:  OMNIPAQUE IOHEXOL 300 MG/ML  SOLN COMPARISON:  None Available. FINDINGS: Lower chest: Unremarkable. Hepatobiliary: No focal abnormalities are seen in the liver. There is no dilation of bile ducts. The bladder is not distended. Pancreas: No focal abnormalities are seen. Spleen: Unremarkable. Adrenals/Urinary Tract: Adrenals are not enlarged. There is no hydronephrosis. There are few small bilateral renal stones each measuring less than 5 mm. Ureters are unremarkable. Urinary bladder is not distended. Stomach/Bowel: Stomach is unremarkable. Small bowel loops are not dilated. There is fluid in the lumen of distal small bowel loops. There is no significant wall thickening in small bowel loops. Appendix is difficult to visualize in MH 62 of series 2, there is here small caliber structure with pocket of air posterior to the cecum, possibly normal appendix. There is no pericecal inflammation. There is mild diffuse wall thickening in cecum and ascending colon. There is mild wall thickening in proximal transverse colon. Scattered diverticula seen in colon without signs of focal acute diverticulitis. Liquid stool is seen in the lumen of the rectum.  Vascular/Lymphatic: Unremarkable. Reproductive: Unremarkable. Other: There is no ascites or pneumoperitoneum. Musculoskeletal: No acute findings are seen. IMPRESSION: There is no evidence of intestinal obstruction or pneumoperitoneum. There is no hydronephrosis. There is wall thickening in right colon. Liquid stool is seen in the lumen of the rectum. Findings suggest possible colitis. There are few small nonobstructing bilateral renal stones. Electronically Signed   By: Ernie Avena M.D.   On: 07/10/2022 14:54   DG Chest 2 View  Result Date: 07/10/2022 CLINICAL DATA:  Shortness of breath. EXAM: CHEST - 2 VIEW COMPARISON:  None Available. FINDINGS: The heart size and mediastinal contours are within normal limits. Both lungs are clear. The visualized skeletal structures are unremarkable. Negative for a pneumothorax. No pleural effusions. IMPRESSION: No active cardiopulmonary disease. Electronically Signed   By: Richarda Overlie M.D.   On: 07/10/2022 12:58  Procedures Procedures    Medications Ordered in ED Medications  albuterol (VENTOLIN HFA) 108 (90 Base) MCG/ACT inhaler 2 puff (has no administration in time range)  fentaNYL (SUBLIMAZE) injection 75 mcg (75 mcg Intravenous Given 07/10/22 1314)  ondansetron (ZOFRAN) injection 4 mg (4 mg Intravenous Given 07/10/22 1314)  sodium chloride 0.9 % bolus 1,000 mL (1,000 mLs Intravenous New Bag/Given 07/10/22 1314)  iohexol (OMNIPAQUE) 300 MG/ML solution 100 mL (100 mLs Intravenous Contrast Given 07/10/22 1424)  sodium chloride (PF) 0.9 % injection (  Given by Other 07/10/22 1427)    ED Course/ Medical Decision Making/ A&P                           Medical Decision Making Amount and/or Complexity of Data Reviewed Labs: ordered. Radiology: ordered.  Risk Prescription drug management.   This patient presents to the ED for concern of abdominal pain, this involves an extensive number of treatment options, and is a complaint that carries with it a high  risk of complications and morbidity.  The differential diagnosis includes appendicitis, nephrolithiasis, gastroenteritis, colitis, pancreatitis, and others   Co morbidities that complicate the patient evaluation  None   Additional history obtained:  Additional history obtained from visitor at bedside   Lab Tests:  I Ordered, and personally interpreted labs.  The pertinent results include: WBC 12.2, lipase 25, creatinine 1.32   Imaging Studies ordered:  I ordered imaging studies including CT abdomen pelvis with contrast I independently visualized and interpreted imaging which showed no evidence of intestinal obstruction or pneumoperitoneum.  No hydronephrosis.  Wall thickening noted in right colon.  Liquid stool seen and limited to the rectum.  Suggestive of possible colitis.  Few nonobstructing small bilateral renal stones. No active cardiopulmonary disease on chest x-ray I agree with the radiologist interpretation   Cardiac Monitoring: / EKG:  The patient was maintained on a cardiac monitor.  I personally viewed and interpreted the cardiac monitored which showed an underlying rhythm of: Sinus rhythm   Problem List / ED Course / Critical interventions / Medication management   I ordered medication including fentanyl for pain, Zofran for nausea Reevaluation of the patient after these medicines showed that the patient improved I have reviewed the patients home medicines and have made adjustments as needed   Social Determinants of Health:  No primary care provider   Test / Admission - Considered:  The patient has no signs of appendicitis or cholecystitis on CT.  Lipase is within normal limits suggesting no pancreatitis.  The patient's pain and nausea improved dramatically after medication was administered.  CT findings were suggestive of colitis.  This is likely an infectious process which will be self-limited.  Patient will rehydrate at home.  I will prescribe a short  course of pain medication, nausea medication, and the patient may take Imodium.        Final Clinical Impression(s) / ED Diagnoses Final diagnoses:  Nausea vomiting and diarrhea  Generalized abdominal pain    Rx / DC Orders ED Discharge Orders          Ordered    ondansetron (ZOFRAN) 4 MG tablet  Every 8 hours PRN        07/10/22 1524    HYDROcodone-acetaminophen (NORCO/VICODIN) 5-325 MG tablet  Every 4 hours PRN        07/10/22 1524              Pamala Duffel 07/10/22  1525    Maia Plan, MD 07/11/22 2249

## 2022-07-10 NOTE — ED Notes (Signed)
Unable to obtain blood work, [facility] asked to draw blood.

## 2022-07-10 NOTE — ED Triage Notes (Addendum)
Patient c/o SOB, N/V/D, and mid abdominal pain since this AM.  Patient added that he is also having bilateral lower back pain.

## 2022-11-02 ENCOUNTER — Ambulatory Visit (HOSPITAL_COMMUNITY)
Admission: EM | Admit: 2022-11-02 | Discharge: 2022-11-02 | Disposition: A | Discharge status: Home / Self Care | Payer: Managed Care, Other (non HMO) | Attending: Physician Assistant | Admitting: Physician Assistant

## 2022-11-02 ENCOUNTER — Encounter (HOSPITAL_COMMUNITY): Payer: Self-pay

## 2022-11-02 DIAGNOSIS — J029 Acute pharyngitis, unspecified: Secondary | ICD-10-CM

## 2022-11-02 DIAGNOSIS — Z1152 Encounter for screening for COVID-19: Secondary | ICD-10-CM | POA: Diagnosis not present

## 2022-11-02 DIAGNOSIS — B349 Viral infection, unspecified: Secondary | ICD-10-CM | POA: Diagnosis not present

## 2022-11-02 LAB — POCT RAPID STREP A, ED / UC: Streptococcus, Group A Screen (Direct): NEGATIVE

## 2022-11-02 LAB — RESP PANEL BY RT-PCR (FLU A&B, COVID) ARPGX2
Influenza A by PCR: NEGATIVE
Influenza B by PCR: NEGATIVE
SARS Coronavirus 2 by RT PCR: NEGATIVE

## 2022-11-02 MED ORDER — LIDOCAINE VISCOUS HCL 2 % MT SOLN
15.0000 mL | Freq: Once | OROMUCOSAL | Status: AC
  Administered 2022-11-02: 15 mL via OROMUCOSAL

## 2022-11-02 MED ORDER — LIDOCAINE VISCOUS HCL 2 % MT SOLN
OROMUCOSAL | Status: AC
  Filled 2022-11-02: qty 15

## 2022-11-02 MED ORDER — BENZONATATE 100 MG PO CAPS: 100.0000 mg | ORAL_CAPSULE | Freq: Three times a day (TID) | ORAL | 0 refills | Status: DC

## 2022-11-02 MED ORDER — PROMETHAZINE-DM 6.25-15 MG/5ML PO SYRP: 5.0000 mL | ORAL_SOLUTION | Freq: Every evening | ORAL | 0 refills | Status: AC

## 2022-11-02 MED ORDER — ALUM & MAG HYDROXIDE-SIMETH 200-200-20 MG/5ML PO SUSP
30.0000 mL | Freq: Once | ORAL | Status: AC
  Administered 2022-11-02: 30 mL via ORAL

## 2022-11-02 MED ORDER — ALUM & MAG HYDROXIDE-SIMETH 200-200-20 MG/5ML PO SUSP
ORAL | Status: AC
  Filled 2022-11-02: qty 30

## 2022-11-02 NOTE — ED Triage Notes (Signed)
Pt states sore throat,cough chest congestion and  pain for the past 3 days.

## 2022-11-02 NOTE — Discharge Instructions (Addendum)
We will call you if any of your test results warrant a change in your plan of care. You may view these test results on MyChart.   Promethazine DM has been sent to the pharmacy, you can use this medication before bedtime, please do not operate any heavy machinery or drive a car after taking this medication. Tessalon has been sent to the pharmacy, you can use this every 8 hours as needed for cough.   Viral illnesses usually takes 7 to 10 days to resolve.  Using over-the-counter medications can help treat the symptoms.  You may rotate Tylenol and ibuprofen every 4-6 hours.  For example, take Tylenol now and then 4 to 6 hours later take ibuprofen.  You can use Tylenol for fever and moderate pain, you can take this medication every 4-6 hours, please do not take more than 3000 mg in a 24-hour day.  You can take ibuprofen every 6 hours, do not take more than 2400 mg in a 24-hour day.  I advised that you do not take ibuprofen on an empty stomach, ibuprofen can cause GI problems such as GI bleeding.  For nasal congestion, you can buy Flonase nasal spray over-the-counter, this is an intranasal steroid.  You will put 2 sprays in each nostril during application.  I suggest that you would use Flonase upon waking in the morning and in the evening.  Mucinex is an expectorant that can be purchased over-the-counter, this can help clear congestion from your respiratory tract.  For Mucinex dosage, please refer to the back of the box for dosage instructions, this can vary depending on the brand of medication.  Chloraseptic throat spray and lozenges can be used for sore throat.  You may use warm liquids and honey for symptom management.  Maintaining hydration status is very important, please drink at least 8 cups of water daily.  Please try to intake nutrient dense meals.

## 2022-11-02 NOTE — ED Provider Notes (Signed)
[facility]    CSN: [id number] Arrival date & time: 11/02/22  3825      History   Chief Complaint Chief Complaint  Patient presents with   Sore Throat    HPI Nicholas House. is a 31 y.o. male. Patient present complaining of sore throat and nonproductive cough x 3 days. Patient reports having a subjective fever at home.  Patient reports painful swallowing.  Patient reports chest pain that is worsened with coughing.  Patient reports having a burning sensation in the middle of his chest.  Patient reports an episode of emesis while in office when the strep swab was being performed, otherwise no emesis before this time. Patient reports having some "bits of red blood" in emesis. Patient denies nausea, diarrhea or abdominal pain. He denies any history of acid reflux. Patient reports SOB with coughing at times.   Patient denies any significant medical history or hospitalizations.  Patient has taken TheraFlu and Mucinex with minimal relief of symptoms.   Patient denies any known exposure to a viral illness or anyone that has been sick.  Patients significant other is at bedside.      Sore Throat Associated symptoms include shortness of breath. Pertinent negatives include no chest pain and no abdominal pain.    History reviewed. No pertinent past medical history.  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 Authorizing Provider  benzonatate (TESSALON) 100 MG capsule Take 1 capsule (100 mg total) by mouth every 8 (eight) hours. 11/02/22  Yes Debby Freiberg, NP  promethazine-dextromethorphan (PROMETHAZINE-DM) 6.25-15 MG/5ML syrup Take 5 mLs by mouth at bedtime. 11/02/22  Yes Debby Freiberg, NP  acetaminophen (TYLENOL) 500 MG tablet Take 500 mg by mouth every 6 (six) hours as needed for mild pain.    [provider]  HYDROcodone-acetaminophen (NORCO/VICODIN) 5-325 MG tablet Take 1 tablet by mouth every 4 (four) hours as needed. 07/10/22   Darrick Grinder, PA-C  ondansetron (ZOFRAN) 4 MG tablet Take 1 tablet (4 mg total) by mouth every 8 (eight) hours as needed for nausea or vomiting. 07/10/22   Darrick Grinder, PA-C    Family History Family History  Problem Relation Age of Onset   Diabetes Mother    Diabetes Father     Social History Social History   Tobacco Use   Smoking status: Some Days    Types: Cigarettes   Smokeless tobacco: Never  Vaping Use   Vaping Use: Never used  Substance Use Topics   Alcohol use: Yes   Drug use: Yes    Types: Marijuana     Allergies   Peanut-containing drug products   Review of Systems Review of Systems  Constitutional:  Positive for activity change, appetite change, chills, fatigue and fever.  HENT:  Positive for congestion, rhinorrhea and sore throat. Negative for ear discharge, ear pain, postnasal drip, sinus pressure, sinus pain, sneezing, tinnitus, trouble swallowing and voice change.   Eyes: Negative.   Respiratory:  Positive for cough and shortness of breath. Negative for chest tightness, wheezing  and stridor.   Cardiovascular:  Negative for chest pain and palpitations.  Gastrointestinal:  Positive for vomiting (1 episode while in office). Negative for abdominal pain, diarrhea and nausea.     Physical Exam Triage Vital Signs ED Triage Vitals  Enc Vitals Group     BP 11/02/22 0843 114/75     Pulse Rate 11/02/22 0843 84     Resp 11/02/22 0843 16     Temp 11/02/22 0843 100.3 F (37.9 C)     Temp Source 11/02/22 0843 Oral     SpO2 11/02/22 0843 96 %     Weight  --      Height --      Head Circumference --      Peak Flow --      Pain Score 11/02/22 0844 8     Pain Loc --      Pain Edu? --      Excl. in GC? --    No data found.  Updated Vital Signs BP 114/75 (BP Location: Right Arm)   Pulse 84   Temp 100.3 F (37.9 C) (Oral)   Resp 16   SpO2 96%      Physical Exam Vitals and nursing note reviewed.  Constitutional:      Appearance: He is well-developed. He is not ill-appearing.  HENT:     Right Ear: Hearing, tympanic membrane, ear canal and external ear normal.     Left Ear: Hearing, tympanic membrane, ear canal and external ear normal.     Nose: Rhinorrhea present. No nasal tenderness or congestion. Rhinorrhea is clear.     Right Turbinates: Not enlarged, swollen or pale.     Left Turbinates: Not enlarged, swollen or pale.     Right Sinus: No maxillary sinus tenderness or frontal sinus tenderness.     Left Sinus: No maxillary sinus tenderness or frontal sinus tenderness.     Mouth/Throat:     Mouth: Mucous membranes are moist.     Dentition: Normal dentition.     Pharynx: Uvula midline. Posterior oropharyngeal erythema present. No pharyngeal swelling, oropharyngeal exudate or uvula swelling.     Tonsils: No tonsillar exudate or tonsillar abscesses. 0 on the right. 0 on the left.  Cardiovascular:     Rate and Rhythm: Normal rate and regular rhythm.     Heart sounds: Normal heart sounds, S1 normal and S2 normal.  Pulmonary:     Effort: Pulmonary effort is normal.     Breath sounds: Normal breath sounds. No decreased breath sounds, wheezing or rhonchi.  Neurological:     Mental Status: He is alert.      UC Treatments / Results  Labs (all labs ordered are listed, but only abnormal results are displayed) Labs Reviewed  CULTURE, GROUP A STREP ([facility])  RESP PANEL BY RT-PCR (FLU A&B, COVID) ARPGX2  POCT RAPID STREP A, ED / UC    EKG   Radiology No results found.  Procedures Procedures (including critical care  time)  Medications Ordered in UC Medications  alum & mag hydroxide-simeth (MAALOX/MYLANTA) 200-200-20 MG/5ML suspension 30 mL (has no administration in time range)  lidocaine (XYLOCAINE) 2 % viscous mouth solution 15 mL (has no administration in time range)    Initial Impression / Assessment and Plan / UC Course  I have reviewed the triage vital signs and the nursing notes.  Pertinent labs & imaging results that were available during my care of the patient were reviewed by me and considered in my  medical decision making (see chart for details).     Patient was evaluated for a viral illness. Strep culture pending due to throat symptoms and to verify no bacterial etiology. COVID and Flu test is pending. Patient educated on "bits of blood" in emesis maybe related to throat irritation and persistent cough. Prescription for Promethazine DM and Tessalon given for cough.  Patient was made aware of safety precautions with Promethazine DM.  GI cocktail given in office and patient did not tolerate medication well. Patient stated the medication was "too thick" and he threw up the medication. Patient declined any nausea medication while in office. I believe chest discomfort, "burning" is related to an acid reflux caused by coughing. Patient was educated on OTC acid reflux medications he could use and to eat blander meals until symptom resolve. Patient was able to have sips of water without emesis before discharge. Patient was given a work note. Patient made aware of timeline for symptom resolution and when follow-up would be necessary.  Patient was made aware of red flag symptoms that would warrant an emergency department visit. Patient made aware of results reporting protocol and MyChart.  Patient verbalized understanding of instructions.    Charting was provided using a a verbal dictation system, charting was proofread for errors, errors may occur which could change the meaning of the information charted.    Final Clinical Impressions(s) / UC Diagnoses   Final diagnoses:  Viral illness     Discharge Instructions      We will call you if any of your test results warrant a change in your plan of care. You may view these test results on MyChart.   Promethazine DM has been sent to the pharmacy, you can use this medication before bedtime, please do not operate any heavy machinery or drive a car after taking this medication. Tessalon has been sent to the pharmacy, you can use this every 8 hours as needed for cough.   Viral illnesses usually takes 7 to 10 days to resolve.  Using over-the-counter medications can help treat the symptoms.  You may rotate Tylenol and ibuprofen every 4-6 hours.  For example, take Tylenol now and then 4 to 6 hours later take ibuprofen.  You can use Tylenol for fever and moderate pain, you can take this medication every 4-6 hours, please do not take more than 3000 mg in a 24-hour day.  You can take ibuprofen every 6 hours, do not take more than 2400 mg in a 24-hour day.  I advised that you do not take ibuprofen on an empty stomach, ibuprofen can cause GI problems such as GI bleeding.  For nasal congestion, you can buy Flonase nasal spray over-the-counter, this is an intranasal steroid.  You will put 2 sprays in each nostril during application.  I suggest that you would use Flonase upon waking in the morning and in the evening.  Mucinex is an expectorant that can be purchased over-the-counter, this can help clear congestion from your respiratory tract.  For Mucinex dosage, please refer to the back of the box for dosage instructions, this can vary depending on the brand of medication.  Chloraseptic throat spray and lozenges can be used for sore throat.  You may use warm liquids and honey for symptom management.  Maintaining hydration status is very important, please drink at least 8 cups of water daily.  Please try to intake nutrient dense meals.      ED Prescriptions      Medication Sig  Dispense Auth. Provider   benzonatate (TESSALON) 100 MG capsule Take 1 capsule (100 mg total) by mouth every 8 (eight) hours. 24 capsule Debby Freiberg, NP   promethazine-dextromethorphan (PROMETHAZINE-DM) 6.25-15 MG/5ML syrup Take 5 mLs by mouth at bedtime. 118 mL Debby Freiberg, NP      PDMP not reviewed this encounter.   Debby Freiberg, NP 11/02/22 [phone]

## 2022-11-05 LAB — CULTURE, GROUP A STREP (THRC)

## 2024-03-26 ENCOUNTER — Other Ambulatory Visit (HOSPITAL_BASED_OUTPATIENT_CLINIC_OR_DEPARTMENT_OTHER): Payer: Self-pay

## 2024-03-26 ENCOUNTER — Emergency Department (HOSPITAL_BASED_OUTPATIENT_CLINIC_OR_DEPARTMENT_OTHER)
Admission: EM | Admit: 2024-03-26 | Discharge: 2024-03-26 | Disposition: A | Discharge status: Home / Self Care | Attending: Emergency Medicine | Admitting: Emergency Medicine

## 2024-03-26 ENCOUNTER — Encounter (HOSPITAL_BASED_OUTPATIENT_CLINIC_OR_DEPARTMENT_OTHER): Payer: Self-pay | Admitting: Emergency Medicine

## 2024-03-26 ENCOUNTER — Other Ambulatory Visit: Payer: Self-pay

## 2024-03-26 DIAGNOSIS — Z91010 Allergy to peanuts: Secondary | ICD-10-CM | POA: Diagnosis not present

## 2024-03-26 DIAGNOSIS — J0100 Acute maxillary sinusitis, unspecified: Secondary | ICD-10-CM | POA: Diagnosis not present

## 2024-03-26 DIAGNOSIS — R0981 Nasal congestion: Secondary | ICD-10-CM | POA: Diagnosis present

## 2024-03-26 DIAGNOSIS — K0889 Other specified disorders of teeth and supporting structures: Secondary | ICD-10-CM | POA: Diagnosis not present

## 2024-03-26 MED ORDER — AMOXICILLIN-POT CLAVULANATE 875-125 MG PO TABS
1.0000 | ORAL_TABLET | Freq: Two times a day (BID) | ORAL | 0 refills | Status: AC
  Filled 2024-03-26: qty 14, 7d supply, fill #0

## 2024-03-26 MED ORDER — LEVOCETIRIZINE DIHYDROCHLORIDE 5 MG PO TABS
5.0000 mg | ORAL_TABLET | Freq: Every evening | ORAL | 0 refills | Status: AC
  Filled 2024-03-26: qty 30, 30d supply, fill #0

## 2024-03-26 MED ORDER — FLUTICASONE PROPIONATE 50 MCG/ACT NA SUSP: 2.0000 | Freq: Every day | NASAL | 0 refills | Status: AC

## 2024-03-26 NOTE — ED Triage Notes (Signed)
 Pt c/o congestion x 2 weeks, TOP RT dental pain for "awhile", recent sore throat

## 2024-03-26 NOTE — ED Notes (Signed)

## 2024-03-26 NOTE — Discharge Instructions (Addendum)
 Take Augmentin  twice daily with food for the next 7 days, be sure to complete the entire course of antibiotics even if symptoms resolve sooner.  To treat allergies discontinue Claritin and Benadryl.  Take Xyzal  (levocetirizine) daily and use Flonase  nasal spray in each nostril daily, using these medications routinely can help keep allergies under control.  Drink plenty of fluids to help thin congestion and secretions.  I have provided information for the dentist on-call as well as dental resources for follow-up regarding your chipped tooth, the antibiotics you have been prescribed today will also treat for any dental pain.  This

## 2024-03-26 NOTE — ED Provider Notes (Signed)
 [place] EMERGENCY DEPARTMENT AT [facility] Provider Note   CSN: [id number] Arrival date & time: 03/26/24  6578     History  Chief Complaint  Patient presents with   Nasal Congestion    Nicholas Farrell. is a 33 y.o. male.  Nicholas Trivino. is a 33 y.o. male who is otherwise healthy, presents to the emergency department for evaluation of 2 weeks of persistent nasal congestion and sinus pain and pressure.  Patient reports symptoms started initially with nasal congestion and quickly developed sinus pressure and pain particularly in the maxillary sinus.  He reports about a week ago he had a few days of sore throat which has since resolved but has had an intermittent dry cough as well.  No known sick contacts.  No fevers or chills.  He has been treating symptoms at home with Claritin and Benadryl without much improvement and since then sinus issues seem to be getting worse.  He reports that he works outside often and is certainly exposed to a lot of pollen.  He has not tried any nasal sprays or sinus rinses.  He also reports that he has intermittent issues with dental pain in a right upper molar.  He reports that this chipped a few years ago and since then he has had intermittent issues, has not seen a dentist regarding this.  He reports that in the last few days he started to have some increasing dental pain in this area.  The history is provided by the patient and a significant other.       Home Medications Prior to Admission medications   Medication Sig Start Date End Date Taking? Authorizing Provider  amoxicillin -clavulanate (AUGMENTIN ) 875-125 MG tablet Take 1 tablet by mouth every 12 (twelve) hours. 03/26/24  Yes Adrieanna Boteler F, PA-C  fluticasone  (FLONASE ) 50 MCG/ACT nasal spray Place 2 sprays into both nostrils daily. 03/26/24  Yes Suriah Peragine F, PA-C  levocetirizine (XYZAL ) 5 MG tablet Take 1 tablet (5 mg total) by mouth every evening. 03/26/24  Yes Rupert Counts F, PA-C  acetaminophen  (TYLENOL ) 500 MG tablet Take 500 mg by mouth every 6 (six) hours as needed for mild pain.    [provider]  benzonatate  (TESSALON ) 100 MG capsule Take 1 capsule (100 mg total) by mouth every 8 (eight) hours. 11/02/22   Wedderburn, Ngozi N, NP  HYDROcodone -acetaminophen  (NORCO/VICODIN) 5-325 MG tablet Take 1 tablet by mouth every 4 (four) hours as needed. 07/10/22   Elisa Guest, PA-C  ondansetron  (ZOFRAN ) 4 MG tablet Take 1 tablet (4 mg total) by mouth every 8 (eight) hours as needed for nausea or vomiting. 07/10/22   Elisa Guest, PA-C  promethazine -dextromethorphan (PROMETHAZINE -DM) 6.25-15 MG/5ML syrup Take 5 mLs by mouth at bedtime. 11/02/22   Wedderburn, Ngozi N, NP      Allergies    Peanut-containing drug products    Review of Systems   Review of Systems  Constitutional:  Negative for chills and fever.  HENT:  Positive for congestion, dental problem, rhinorrhea, sinus pressure, sinus pain and sore throat. Negative for facial swelling.   Respiratory:  Positive for cough. Negative for shortness of breath.   Cardiovascular:  Negative for chest pain.  Gastrointestinal:  Negative for nausea and vomiting.    Physical Exam Updated Vital Signs BP 123/84 (BP Location: Right Arm)   Pulse 81   Temp 98.2 F (36.8 C)   Resp 16   Ht 6\' 4"  (1.93 m)  Wt 93 kg   SpO2 99%   BMI 24.95 kg/m  Physical Exam Vitals and nursing note reviewed.  Constitutional:      General: He is not in acute distress.    Appearance: Normal appearance. He is well-developed. He is not ill-appearing or diaphoretic.  HENT:     Head: Normocephalic and atraumatic.     Right Ear: Tympanic membrane and ear canal normal.     Left Ear: Tympanic membrane and ear canal normal.     Nose: Congestion and rhinorrhea present.     Comments: Bilateral nasal mucosal edema and erythema, with bilateral clear rhinorrhea present.  Bilateral maxillary sinus tenderness present.     Mouth/Throat:     Mouth: Mucous membranes are moist.     Pharynx: Oropharynx is clear. No oropharyngeal exudate or posterior oropharyngeal erythema.     Comments: Right upper posterior molar with portion of tooth chipped, no surrounding erythema edema or evidence of abscess, no tenderness or swelling over the roof of the mouth. Eyes:     General:        Right eye: No discharge.        Left eye: No discharge.  Cardiovascular:     Rate and Rhythm: Normal rate and regular rhythm.     Heart sounds: Normal heart sounds.  Pulmonary:     Effort: Pulmonary effort is normal. No respiratory distress.     Breath sounds: Normal breath sounds.     Comments: Respirations equal and unlabored, patient able to speak in full sentences, lungs clear to auscultation bilaterally  Musculoskeletal:     Cervical back: Neck supple.  Skin:    General: Skin is warm and dry.  Neurological:     Mental Status: He is alert and oriented to person, place, and time.     Coordination: Coordination normal.  Psychiatric:        Mood and Affect: Mood normal.        Behavior: Behavior normal.     ED Results / Procedures / Treatments   Labs (all labs ordered are listed, but only abnormal results are displayed) Labs Reviewed - No data to display  EKG None  Radiology No results found.  Procedures Procedures    Medications Ordered in ED Medications - No data to display  ED Course/ Medical Decision Making/ A&P                                 Medical Decision Making  Presentation consistent with sinusitis, suspect symptoms initially began from allergic rhinitis but given 2 weeks of persistent worsening with bilateral sinus pressure concern for potential superimposed bacterial sinus infection.  Will treat for Augmentin , this will cover for any potential dental infection as well, no evidence of dental abscess, no sublingual tenderness or swelling or difficulty tolerating secretions, no concern for Ludwig's  angina.  Provided resources for dental follow-up.  Also adjusted patient's allergy medication regimen will have him begin taking levo cetirizine and Flonase  daily.  Discussed additional measures for supportive care and provided return precautions.  Discharged home in good condition.        Final Clinical Impression(s) / ED Diagnoses Final diagnoses:  Acute non-recurrent maxillary sinusitis  Pain, dental    Rx / DC Orders ED Discharge Orders          Ordered    levocetirizine (XYZAL ) 5 MG tablet  Every evening  03/26/24 1020    fluticasone  (FLONASE ) 50 MCG/ACT nasal spray  Daily        03/26/24 1020    amoxicillin -clavulanate (AUGMENTIN ) 875-125 MG tablet  Every 12 hours        03/26/24 1020              Everlyn Hockey, PA-C 03/26/24 1055    Zackowski, Scott, MD 03/30/24 0800

## 2024-06-24 ENCOUNTER — Other Ambulatory Visit: Payer: Self-pay

## 2024-06-24 ENCOUNTER — Emergency Department (HOSPITAL_COMMUNITY)
Admission: EM | Admit: 2024-06-24 | Discharge: 2024-06-24 | Disposition: A | Discharge status: Home / Self Care | Attending: Emergency Medicine | Admitting: Emergency Medicine

## 2024-06-24 ENCOUNTER — Encounter (HOSPITAL_COMMUNITY): Payer: Self-pay

## 2024-06-24 DIAGNOSIS — J029 Acute pharyngitis, unspecified: Secondary | ICD-10-CM | POA: Insufficient documentation

## 2024-06-24 DIAGNOSIS — Z91010 Allergy to peanuts: Secondary | ICD-10-CM | POA: Diagnosis not present

## 2024-06-24 MED ORDER — LIDOCAINE VISCOUS HCL 2 % MT SOLN
15.0000 mL | Freq: Once | OROMUCOSAL | Status: AC
  Administered 2024-06-24: 15 mL via OROMUCOSAL
  Filled 2024-06-24: qty 15

## 2024-06-24 MED ORDER — BENZONATATE 100 MG PO CAPS: 100.0000 mg | ORAL_CAPSULE | Freq: Three times a day (TID) | ORAL | 0 refills | Status: AC

## 2024-06-24 NOTE — Discharge Instructions (Addendum)
 You may take over-the-counter medicine for symptomatic relief, such as Tylenol, Motrin, TheraFlu, Alka seltzer , black elderberry, etc. Please limit acetaminophen (Tylenol) to 4000 mg and Ibuprofen (Motrin, Advil, etc.) to 2400 mg for a 24hr period. Please note that other over-the-counter medicine may contain acetaminophen or ibuprofen as a component of their ingredients.

## 2024-06-24 NOTE — ED Provider Notes (Signed)
 [place] EMERGENCY DEPARTMENT AT [facility] Provider Note  CSN: [id number] Arrival date & time: 06/24/24 0126  Chief Complaint(s) Sore Throat  HPI Nicholas House. is a 33 y.o. male here with 5 days of sore throat.  Patient has been taking over-the-counter medicine with minimal relief.  He went to urgent care yesterday and had negative COVID, influenza and rapid strep.  Pain with swallowing.  He denies any fevers or chills.  No coughing or congestion.  No other physical complaints.   Sore Throat    Past Medical History History reviewed. No pertinent past medical history. 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 Authorizing Provider  benzonatate  (TESSALON ) 100 MG capsule Take 1 capsule (100 mg total) by mouth every 8 (eight) hours. 06/24/24  Yes Myrella Fahs, Raynell Moder, MD  acetaminophen  (TYLENOL ) 500 MG tablet Take 500 mg by mouth every 6 (six) hours as needed for mild pain.    [provider]  amoxicillin -clavulanate (AUGMENTIN ) 875-125 MG tablet Take 1 tablet by mouth every 12 (twelve) hours. 03/26/24   Kehrli, Kelsey F, PA-C  fluticasone  (FLONASE ) 50 MCG/ACT nasal spray Place 2 sprays into both nostrils daily. 03/26/24   Kehrli, Kelsey F, PA-C  HYDROcodone -acetaminophen  (NORCO/VICODIN) 5-325 MG tablet Take 1 tablet by mouth every 4 (four) hours as needed. 07/10/22   Logan Martinis B, PA-C  levocetirizine (XYZAL ) 5 MG tablet Take 1 tablet (5 mg total) by mouth every evening. 03/26/24   Alva Larraine FALCON, PA-C  ondansetron  (ZOFRAN ) 4 MG tablet Take 1 tablet (4 mg total) by mouth every 8 (eight) hours as needed for nausea or vomiting. 07/10/22   Logan Martinis NOVAK, PA-C  promethazine -dextromethorphan (PROMETHAZINE -DM) 6.25-15 MG/5ML syrup Take 5 mLs by mouth at bedtime. 11/02/22   Wedderburn,  Ngozi N, NP                                                                                                                                    Allergies Peanut-containing drug products  Review of Systems Review of Systems As noted in HPI  Physical Exam Vital Signs  I have reviewed the triage vital signs BP (!) 137/94 (BP Location: Right Arm)   Pulse 68   Temp 98 F (36.7 C) (Oral)   Resp 15   Ht 6' 4 (1.93 m)   Wt 93 kg   SpO2 95%   BMI 24.95 kg/m   Physical Exam Vitals reviewed.  Constitutional:      General: He is not in acute distress.    Appearance: He is well-developed. He is not diaphoretic.  HENT:     Head: Normocephalic and atraumatic.     Right Ear: Tympanic membrane and external ear normal.     Left Ear: Tympanic  membrane and external ear normal.     Nose: Mucosal edema present. No rhinorrhea.     Right Turbinates: Enlarged.     Mouth/Throat:     Mouth: Mucous membranes are moist.     Tongue: No lesions.     Pharynx: No uvula swelling.     Tonsils: No tonsillar exudate or tonsillar abscesses. 2+ on the right. 2+ on the left.     Comments: Epiglottis visible; no edema or erythema Eyes:     General: No scleral icterus.    Conjunctiva/sclera: Conjunctivae normal.  Neck:     Trachea: Phonation normal.  Cardiovascular:     Rate and Rhythm: Normal rate and regular rhythm.  Pulmonary:     Effort: Pulmonary effort is normal. No respiratory distress.     Breath sounds: No stridor.  Abdominal:     General: There is no distension.  Musculoskeletal:        General: Normal range of motion.     Cervical back: Normal range of motion.  Lymphadenopathy:     Head:     Right side of head: Submandibular adenopathy present.     Left side of head: Submandibular adenopathy present.  Neurological:     Mental Status: He is alert and oriented to person, place, and time.  Psychiatric:        Behavior: Behavior normal.     ED Results and Treatments Labs (all labs  ordered are listed, but only abnormal results are displayed) Labs Reviewed - No data to display                                                                                                                       EKG  EKG Interpretation Date/Time:    Ventricular Rate:    PR Interval:    QRS Duration:    QT Interval:    QTC Calculation:   R Axis:      Text Interpretation:         Radiology No results found.  Medications Ordered in ED Medications  lidocaine  (XYLOCAINE ) 2 % viscous mouth solution 15 mL (has no administration in time range)   Procedures Procedures  (including critical care time) Medical Decision Making / ED Course   Medical Decision Making Amount and/or Complexity of Data Reviewed External Data Reviewed: labs.  Risk Prescription drug management.    Patient presents with viral symptoms for 5 days. Adequate oral hydration. Rest of history as above.  Patient appears well. No signs of toxicity, patient is interactive. No hypoxia, tachypnea or other signs of respiratory distress. No sign of clinical dehydration. Rest of exam as above.  Recent COVID, FLU, STREP negative at UC.  Most consistent with viral pharyngitis.    Discussed symptomatic treatment with the patient and they will follow closely with their PCP.      Final Clinical Impression(s) / ED Diagnoses Final diagnoses:  Sore throat   The patient appears reasonably screened and/or stabilized for discharge and I  doubt any other medical condition or other [facility] requiring further screening, evaluation, or treatment in the ED at this time. I have discussed the findings, Dx and Tx plan with the patient/family who expressed understanding and agree(s) with the plan. Discharge instructions discussed at length. The patient/family was given strict return precautions who verbalized understanding of the instructions. No further questions at time of discharge.  Disposition: Discharge  Condition: Good  ED  Discharge Orders          Ordered    benzonatate  (TESSALON ) 100 MG capsule  Every 8 hours        06/24/24 0236             Follow Up: Primary care provider  Call  to schedule an appointment for close follow up    This chart was dictated using voice recognition software.  Despite best efforts to proofread,  errors can occur which can change the documentation meaning.    Trine Raynell Moder, MD 06/24/24 [phone]

## 2024-06-24 NOTE — ED Triage Notes (Signed)
 Pt complaining of sore throat for the last 5 days. Hurts to swallow. Went to urgent and was tested for covid,flu,and strep yesterday and was negative.
